# Patient Record
Sex: Male | Born: 1960 | Race: White | Hispanic: No | Marital: Married | Smoking: Former smoker
Health system: Southern US, Community
[De-identification: ages and names within clinical notes are randomized; demographics above are authoritative.]

## PROBLEM LIST (undated history)

## (undated) DIAGNOSIS — I482 Chronic atrial fibrillation, unspecified: Secondary | ICD-10-CM

## (undated) DIAGNOSIS — C801 Malignant (primary) neoplasm, unspecified: Secondary | ICD-10-CM

## (undated) DIAGNOSIS — E119 Type 2 diabetes mellitus without complications: Secondary | ICD-10-CM

## (undated) DIAGNOSIS — E039 Hypothyroidism, unspecified: Secondary | ICD-10-CM

## (undated) DIAGNOSIS — K219 Gastro-esophageal reflux disease without esophagitis: Secondary | ICD-10-CM

## (undated) DIAGNOSIS — Z95810 Presence of automatic (implantable) cardiac defibrillator: Secondary | ICD-10-CM

## (undated) DIAGNOSIS — R0600 Dyspnea, unspecified: Secondary | ICD-10-CM

## (undated) DIAGNOSIS — I509 Heart failure, unspecified: Secondary | ICD-10-CM

## (undated) DIAGNOSIS — Z950 Presence of cardiac pacemaker: Secondary | ICD-10-CM

## (undated) DIAGNOSIS — M1990 Unspecified osteoarthritis, unspecified site: Secondary | ICD-10-CM

## (undated) DIAGNOSIS — G4730 Sleep apnea, unspecified: Secondary | ICD-10-CM

## (undated) DIAGNOSIS — I499 Cardiac arrhythmia, unspecified: Secondary | ICD-10-CM

## (undated) HISTORY — DX: Presence of automatic (implantable) cardiac defibrillator: Z95.810

## (undated) HISTORY — PX: WRIST SURGERY: SHX841

## (undated) HISTORY — PX: APPENDECTOMY: SHX54

## (undated) HISTORY — PX: PACEMAKER PLACEMENT: SHX43

## (undated) HISTORY — PX: ROTATOR CUFF REPAIR: SHX139

## (undated) HISTORY — PX: SHOULDER SURGERY: SHX246

---

## 2002-07-23 ENCOUNTER — Ambulatory Visit (HOSPITAL_COMMUNITY): Admission: RE | Admit: 2002-07-23 | Discharge: 2002-07-23 | Payer: Self-pay | Admitting: Cardiology

## 2007-05-16 ENCOUNTER — Ambulatory Visit (HOSPITAL_COMMUNITY): Payer: Self-pay | Admitting: Psychology

## 2007-06-22 ENCOUNTER — Ambulatory Visit (HOSPITAL_COMMUNITY): Payer: Self-pay | Admitting: Psychology

## 2007-07-05 ENCOUNTER — Ambulatory Visit (HOSPITAL_COMMUNITY): Payer: Self-pay | Admitting: Psychology

## 2007-08-10 ENCOUNTER — Ambulatory Visit (HOSPITAL_COMMUNITY): Payer: Self-pay | Admitting: Psychology

## 2007-09-10 ENCOUNTER — Ambulatory Visit (HOSPITAL_COMMUNITY): Payer: Self-pay | Admitting: Psychology

## 2007-10-08 ENCOUNTER — Ambulatory Visit (HOSPITAL_COMMUNITY): Payer: Self-pay | Admitting: Psychology

## 2007-11-05 ENCOUNTER — Ambulatory Visit (HOSPITAL_COMMUNITY): Payer: Self-pay | Admitting: Psychology

## 2007-12-07 ENCOUNTER — Ambulatory Visit (HOSPITAL_COMMUNITY): Payer: Self-pay | Admitting: Psychology

## 2010-02-24 ENCOUNTER — Ambulatory Visit: Payer: Self-pay | Admitting: Anesthesiology

## 2010-04-28 ENCOUNTER — Ambulatory Visit: Payer: Self-pay | Admitting: Anesthesiology

## 2010-05-27 ENCOUNTER — Ambulatory Visit: Payer: Self-pay | Admitting: Anesthesiology

## 2010-06-29 ENCOUNTER — Ambulatory Visit: Payer: Self-pay | Admitting: Anesthesiology

## 2010-08-24 ENCOUNTER — Ambulatory Visit: Payer: Self-pay | Admitting: Anesthesiology

## 2010-10-22 ENCOUNTER — Ambulatory Visit: Payer: Self-pay | Admitting: Anesthesiology

## 2010-12-15 ENCOUNTER — Ambulatory Visit: Payer: Self-pay | Admitting: Anesthesiology

## 2011-02-09 ENCOUNTER — Ambulatory Visit: Payer: Self-pay | Admitting: Anesthesiology

## 2011-04-11 ENCOUNTER — Ambulatory Visit: Payer: Self-pay | Admitting: Anesthesiology

## 2011-05-13 NOTE — Op Note (Signed)
NAME:  Adam Andrade, Adam Andrade                          ACCOUNT NO.:  [id number]   MEDICAL RECORD NO.:  [id number]                   PATIENT TYPE:  OIB   LOCATION:  NA                                   FACILITY:  [facility]   PHYSICIAN:  Vonna Kotyk R. Jacinto Halim, M.D.                  DATE OF BIRTH:  1961/01/27   DATE OF PROCEDURE:  DATE OF DISCHARGE:                                 OPERATIVE REPORT   j   PROCEDURE PERFORMED:  Transesophageal echocardiogram and direct current  cardioversion.   SURGEON:  Cristy Hilts. Jacinto Halim, M.D.   INDICATIONS FOR PROCEDURE:  The patient is a 50 year old gentleman with a  history of nonischemic cardiomyopathy with ejection fraction of 30%.  Was  found to be in atrial fibrillation.  He was adequately anticoagulated and he  was referred for evaluation of possible electrical cardioversion given his  left ventricular systolic dysfunction.  A transesophageal echocardiogram is  being performed to evaluate for left atrial appendage clot.   DESCRIPTION OF PROCEDURE:  Using mild sedation using Versed and Demerol, and  using local anesthetic spray, a [employer] O[employer]ipl[employer] probe was easily  introduced into the esophagus and transesophageal echocardiogram was  performed.  There was no complication noted.   LEFT ATRIUM:  The left atrium showed moderate left enlargement.  Left  ventricle:  The left ventricle shows left ventricular size is normal.  There  is markedly generalized hypokinesis.  Ejection fraction was estimated at  around 30%.   RIGHT ATRIUM:  The right atrium shows mild right atrial enlargement.  Right  ventricle:  The right ventricle is normal.   PERICARDIUM:  The pericardium is normal.  Mitral valve:  The mitral valve is  normal.  There is mild mitral valve regurgitation.   TRICUSPID VALVE:  The tricuspid valve is normal.  There is minimal tricuspid  valve regurgitation.   PULMONARY VALVE:  The pulmonary valve is grossly normal.   AORTIC VALVE:  The aortic valve  is normal.  Left atrial appendage:  The left  atrial appendage is well-visualized.  There is no evidence of left atrial  appendage clot by TEE.  The left atrial appendage velocities were 35 cm per  second.   IMPRESSION:  1. Markedly reduced left ventricular systolic function, ejection fraction     30%.  Marked generalized hypokinesis.  2. Mild mitral valve regurgitation.  3. No evidence of left atrial appendage clot by transesophageal     echocardiogram.   RECOMMENDATIONS:  We will proceed with direct current cardioversion.   TECHNIQUE OF DIRECT CURRENT CARDIOVERSION:  With the help of Department of  Anesthesia using Pentothal for deep sedation, direct current cardioversion  was attempted.  The direct current cardioversion was unsuccessful.  Initially, a 200 joule, and then a 300 joule, and a third shock at 300  joules delivered, but the patient continued to remain in atrial  fibrillation.  Hence, the procedure was abandoned.   RECOMMENDATIONS:  Antiarrhythmic therapy, probably with amiodarone, and  reattempt in about four to six weeks can be performed as an outpatient.  Anticoagulation is indicated.  The patient will follow up with Dr.  Alanda Amass.                                                 Cristy Hilts. Jacinto Halim, M.D.    Pilar Plate  D:  07/23/2002  T:  07/25/2002  Job:  [id number]   cc:   Gerlene Burdock A. Alanda Amass, M.D.   [facility]

## 2011-06-01 ENCOUNTER — Ambulatory Visit: Payer: Self-pay | Admitting: Anesthesiology

## 2011-08-02 ENCOUNTER — Ambulatory Visit: Payer: Self-pay | Admitting: Anesthesiology

## 2011-10-07 ENCOUNTER — Ambulatory Visit: Payer: Self-pay | Admitting: Anesthesiology

## 2011-12-09 ENCOUNTER — Ambulatory Visit: Payer: Self-pay | Admitting: Anesthesiology

## 2012-03-01 ENCOUNTER — Ambulatory Visit: Payer: Self-pay | Admitting: Anesthesiology

## 2012-04-27 ENCOUNTER — Ambulatory Visit: Payer: Self-pay | Admitting: Anesthesiology

## 2012-07-26 ENCOUNTER — Ambulatory Visit: Payer: Self-pay | Admitting: Anesthesiology

## 2012-09-10 ENCOUNTER — Ambulatory Visit: Payer: Self-pay | Admitting: Anesthesiology

## 2012-11-15 ENCOUNTER — Ambulatory Visit: Payer: Self-pay | Admitting: Anesthesiology

## 2013-01-17 ENCOUNTER — Ambulatory Visit: Payer: Self-pay | Admitting: Anesthesiology

## 2013-03-13 ENCOUNTER — Ambulatory Visit: Payer: Self-pay | Admitting: Anesthesiology

## 2013-05-29 DIAGNOSIS — C099 Malignant neoplasm of tonsil, unspecified: Secondary | ICD-10-CM | POA: Insufficient documentation

## 2013-05-29 DIAGNOSIS — L409 Psoriasis, unspecified: Secondary | ICD-10-CM | POA: Insufficient documentation

## 2013-05-31 DIAGNOSIS — F4320 Adjustment disorder, unspecified: Secondary | ICD-10-CM | POA: Insufficient documentation

## 2013-05-31 DIAGNOSIS — K219 Gastro-esophageal reflux disease without esophagitis: Secondary | ICD-10-CM | POA: Insufficient documentation

## 2013-05-31 DIAGNOSIS — I10 Essential (primary) hypertension: Secondary | ICD-10-CM | POA: Insufficient documentation

## 2013-05-31 DIAGNOSIS — E669 Obesity, unspecified: Secondary | ICD-10-CM | POA: Insufficient documentation

## 2013-05-31 DIAGNOSIS — E785 Hyperlipidemia, unspecified: Secondary | ICD-10-CM | POA: Insufficient documentation

## 2013-06-03 ENCOUNTER — Ambulatory Visit: Payer: Self-pay | Admitting: Anesthesiology

## 2013-08-05 ENCOUNTER — Ambulatory Visit: Payer: Self-pay | Admitting: Anesthesiology

## 2013-08-06 DIAGNOSIS — I951 Orthostatic hypotension: Secondary | ICD-10-CM | POA: Insufficient documentation

## 2013-08-07 DIAGNOSIS — K5900 Constipation, unspecified: Secondary | ICD-10-CM | POA: Insufficient documentation

## 2013-10-04 ENCOUNTER — Ambulatory Visit: Payer: Self-pay | Admitting: Anesthesiology

## 2013-10-05 ENCOUNTER — Emergency Department (HOSPITAL_COMMUNITY)
Admission: EM | Admit: 2013-10-05 | Discharge: 2013-10-06 | Disposition: A | Discharge status: Home / Self Care | Payer: BC Managed Care – PPO | Attending: Emergency Medicine | Admitting: Emergency Medicine

## 2013-10-05 ENCOUNTER — Encounter (HOSPITAL_COMMUNITY): Payer: Self-pay | Admitting: Emergency Medicine

## 2013-10-05 ENCOUNTER — Emergency Department (HOSPITAL_COMMUNITY): Payer: BC Managed Care – PPO

## 2013-10-05 DIAGNOSIS — Z79899 Other long term (current) drug therapy: Secondary | ICD-10-CM | POA: Insufficient documentation

## 2013-10-05 DIAGNOSIS — C140 Malignant neoplasm of pharynx, unspecified: Secondary | ICD-10-CM | POA: Insufficient documentation

## 2013-10-05 DIAGNOSIS — K9429 Other complications of gastrostomy: Secondary | ICD-10-CM | POA: Insufficient documentation

## 2013-10-05 DIAGNOSIS — R109 Unspecified abdominal pain: Secondary | ICD-10-CM

## 2013-10-05 DIAGNOSIS — Z7901 Long term (current) use of anticoagulants: Secondary | ICD-10-CM | POA: Insufficient documentation

## 2013-10-05 DIAGNOSIS — C760 Malignant neoplasm of head, face and neck: Secondary | ICD-10-CM | POA: Insufficient documentation

## 2013-10-05 DIAGNOSIS — R791 Abnormal coagulation profile: Secondary | ICD-10-CM | POA: Insufficient documentation

## 2013-10-05 DIAGNOSIS — I509 Heart failure, unspecified: Secondary | ICD-10-CM | POA: Insufficient documentation

## 2013-10-05 DIAGNOSIS — Z950 Presence of cardiac pacemaker: Secondary | ICD-10-CM | POA: Insufficient documentation

## 2013-10-05 DIAGNOSIS — Y838 Other surgical procedures as the cause of abnormal reaction of the patient, or of later complication, without mention of misadventure at the time of the procedure: Secondary | ICD-10-CM | POA: Insufficient documentation

## 2013-10-05 DIAGNOSIS — Z87891 Personal history of nicotine dependence: Secondary | ICD-10-CM | POA: Insufficient documentation

## 2013-10-05 HISTORY — DX: Malignant (primary) neoplasm, unspecified: C80.1

## 2013-10-05 HISTORY — DX: Presence of cardiac pacemaker: Z95.0

## 2013-10-05 HISTORY — DX: Heart failure, unspecified: I50.9

## 2013-10-05 MED ORDER — FENTANYL CITRATE 0.05 MG/ML IJ SOLN
50.0000 ug | INTRAMUSCULAR | Status: DC | PRN
  Administered 2013-10-06: 50 ug via INTRAVENOUS
  Filled 2013-10-05: qty 2

## 2013-10-05 MED ORDER — ONDANSETRON HCL 4 MG/2ML IJ SOLN
4.0000 mg | Freq: Once | INTRAMUSCULAR | Status: AC
  Administered 2013-10-06: 4 mg via INTRAVENOUS
  Filled 2013-10-05: qty 2

## 2013-10-05 MED ORDER — SODIUM CHLORIDE 0.9 % IV SOLN
INTRAVENOUS | Status: DC
  Administered 2013-10-06: via INTRAVENOUS

## 2013-10-05 NOTE — ED Notes (Signed)
Pt c/o pain and some drainage from feeding tube today.

## 2013-10-05 NOTE — ED Notes (Signed)
Pt c/o pain with movement to site of feeding tube that just started today and c/o of drainage more than usual, denies fever or N/V

## 2013-10-05 NOTE — ED Provider Notes (Signed)
CSN: [id number]     Arrival date & time 10/05/13  2130 History   None   Scribed for Adam Nielsen, MD, the patient was seen in room APA07/APA07. This chart was scribed by Lewanda Rife, ED scribe. Patient's care was started at 11:14 PM  Chief Complaint  Patient presents with  . feeding tube pain    (Consider location/radiation/quality/duration/timing/severity/associated sxs/prior Treatment) The history is provided by the patient. No language interpreter was used.   HPI Comments: Adam Andrade is a 52 y.o. male who presents to the Emergency Department complaining of constant worsening pain around feeding tube onset today. Reports associated discharge around feeding tube, mild dysphagia secondary to radiation therapy, nausea, and abdominal pain. Describes pain focal and moderate in severity. Denies associated emesis, fever, and back pain. 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 Reports he has not used feeding tube for 2 days and can have PO foods and fluids.   Dr. Earna Coder in [place]  Past Medical History  Diagnosis Date  . Cancer     neck and throat HPV+1  . CHF (congestive heart failure)   . Pacemaker    Past Surgical History  Procedure Laterality Date  . Shoulder surgery    . Appendectomy    . Wrist surgery    . Rotator cuff repair     History reviewed. No pertinent family history. History  Substance Use Topics  . Smoking status: Former Games developer  . Smokeless tobacco: Not on file  . Alcohol Use: No    Review of Systems  Constitutional: Negative for fever.  Gastrointestinal: Positive for abdominal pain. Negative for vomiting.       Feeding tube pain    Musculoskeletal: Negative for back pain.  All other systems reviewed and are negative.  A complete 10 system review of systems was obtained and all systems are negative except as noted in the HPI and PMHx.     Allergies  Review  of patient's allergies indicates no known allergies.  Home Medications   Current Outpatient Rx  Name  Route  Sig  Dispense  Refill  . Dapagliflozin Propanediol (FARXIGA) 5 MG TABS   Oral   Take 5 mg by mouth daily.         . digoxin (LANOXIN) 0.25 MG tablet   Oral   Take 0.25 mg by mouth daily.         . fenofibrate (TRICOR) 145 MG tablet   Oral   Take 145 mg by mouth daily.         Marland Kitchen glimepiride (AMARYL) 4 MG tablet   Oral   Take 4 mg by mouth daily before breakfast.         . HYDROcodone-acetaminophen (NORCO) 10-325 MG per tablet   Oral   Take 1 tablet by mouth every 6 (six) hours as needed.         . lansoprazole (PREVACID) 30 MG capsule   Oral   Take 30 mg by mouth daily.         Marland Kitchen linagliptin (TRADJENTA) 5 MG TABS tablet   Oral   Take 5 mg by mouth daily.         . metFORMIN (GLUCOPHAGE) 1000 MG tablet   Oral   Take 1,000 mg by mouth 2 (two) times daily with a meal.         . metoprolol (LOPRESSOR) 50 MG tablet  Oral   Take 75 mg by mouth 2 (two) times daily.         . quinapril (ACCUPRIL) 10 MG tablet   Oral   Take 10 mg by mouth daily.         . simvastatin (ZOCOR) 40 MG tablet   Oral   Take 40 mg by mouth every evening.         Marland Kitchen spironolactone (ALDACTONE) 25 MG tablet   Oral   Take 25 mg by mouth daily.         Marland Kitchen venlafaxine (EFFEXOR) 75 MG tablet   Oral   Take 75 mg by mouth daily.         Marland Kitchen warfarin (COUMADIN) 5 MG tablet   Oral   Take 5-7.5 mg by mouth daily. Patient takes 1&1/2 tablet(7.5mg ) on Mon.,Wed.,Fri. And 1 tablet(5mg ) all other days          BP 118/85  Pulse 92  Temp(Src) 97.8 F (36.6 C) (Oral)  Resp 20  Ht 5\' 10"  (1.778 m)  Wt 232 lb (105.235 kg)  BMI 33.29 kg/m2  SpO2 97% Physical Exam  Nursing note and vitals reviewed. Constitutional: He is oriented to person, place, and time. He appears well-developed and well-nourished. No distress.  HENT:  Head: Normocephalic and atraumatic.   Mouth/Throat: Uvula is midline. Mucous membranes are dry. No oropharyngeal exudate, posterior oropharyngeal edema or posterior oropharyngeal erythema.  Small area of mild white plaque   Eyes: Conjunctivae and EOM are normal.  Neck: Neck supple. No tracheal deviation present.  Cardiovascular: Normal rate and regular rhythm.   No murmur heard. Pulmonary/Chest: Effort normal and breath sounds normal. No respiratory distress. He has no wheezes. He has no rales.  Abdominal: Soft. There is tenderness.  TTP surrounding feeding tube with some purulent drainage, no erythema, no underlying fluctuance. No abdominal tenderness otherwise.  Musculoskeletal: Normal range of motion.  Neurological: He is alert and oriented to person, place, and time.  Skin: Skin is warm and dry.  Psychiatric: He has a normal mood and affect. His behavior is normal.    ED Course  Procedures (including critical care time)  COORDINATION OF CARE:  Nursing notes reviewed. Vital signs reviewed. Initial pt interview and examination performed.   11:16 PM-Discussed work up plan with pt at bedside, which includes rapid strep screen, CT of abdomen, lipase, CBC, digoxin level, protime-INR, and CMP. Pt agrees with plan.   Treatment plan initiated: Medications  0.9 %  sodium chloride infusion (not administered)  fentaNYL (SUBLIMAZE) injection 50 mcg (not administered)  ondansetron (ZOFRAN) injection 4 mg (not administered)     Initial diagnostic testing ordered.    Labs Review Labs Reviewed  CBC - Abnormal; Notable for the following:    RBC 4.02 (*)    Hemoglobin 11.9 (*)    HCT 35.6 (*)    RDW 15.7 (*)    All other components within normal limits  COMPREHENSIVE METABOLIC PANEL - Abnormal; Notable for the following:    Sodium 134 (*)    Glucose, Bld 60 (*)    Albumin 3.3 (*)    All other components within normal limits  LIPASE, BLOOD - Abnormal; Notable for the following:    Lipase 103 (*)    All other  components within normal limits  DIGOXIN LEVEL - Abnormal; Notable for the following:    Digoxin Level 0.4 (*)    All other components within normal limits  PROTIME-INR - Abnormal; Notable for the following:  Prothrombin Time 37.3 (*)    INR 3.98 (*)    All other components within normal limits  RAPID STREP SCREEN  CULTURE, GROUP A STREP   Imaging Review Ct Abdomen Pelvis W Contrast  10/06/2013   CLINICAL DATA:  Increased pain and drainage at the location of a feeding tube placed 4 weeks ago.  EXAM: CT ABDOMEN AND PELVIS WITH CONTRAST  TECHNIQUE: Multidetector CT imaging of the abdomen and pelvis was performed using the standard protocol following bolus administration of intravenous contrast.  CONTRAST:  OMNIPAQUE IOHEXOL 300 MG/ML  SOLN  COMPARISON:  None.  FINDINGS: Intracardiac pacer and AICD leads. Enlarged left atrium. Normal appearing gastrostomy tube with the button normally positioned within the anterior aspect of the gastric antrum. No adjacent fluid or gas collections.  10 mm mid to upper left renal calculus. 11 mm lower pole right renal calculus. Multiple small bilateral renal cysts. No bladder or ureteral calculi and no hydronephrosis.  Unremarkable non contrasted appearance of the liver, spleen, pancreas and adrenal glands. Normal-sized prostate gland containing a small amount of calcification. Poorly distended gallbladder with no visible gallstones. Redundant sigmoid colon. Surgically absent appendix. Multiple tiny calcified granulomata in the left lower lobe. Lumbar and lower thoracic spine degenerative changes.  IMPRESSION: 1. 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 2. Normal appearing gastrostomy tube. 3. Left atrial enlargement.   Electronically Signed   By: Gordan Payment M.D.   On: 10/06/2013 01:08    IV fluids. IV Zofran. IV fentanyl  On recheck, states pain is improved, minimal tenderness on exam. Plan discharge home  with outpatient followup. Prescription for Keflex provided. Short course pain medications provided. Strict return precautions verbalized is understood. Patient will followup with his primary care physician regarding elevated INR - he will hold his next dose of Coumadin to talk with his physician on Monday prior to resuming his medication.     MDM  Diagnosis: Abdominal pain, elevated INR  Evaluated with labs and imaging reviewed as above - no abscess on CT Improved with IV narcotics Vital signs and nursing notes reviewed and considered   I personally performed the services described in this documentation, which was scribed in my presence. The recorded information has been reviewed and is accurate.    Adam Nielsen, MD 10/06/13 [phone]

## 2013-10-06 LAB — COMPREHENSIVE METABOLIC PANEL
AST: 17 U/L (ref 0–37)
Albumin: 3.3 g/dL — ABNORMAL LOW (ref 3.5–5.2)
Alkaline Phosphatase: 62 U/L (ref 39–117)
BUN: 12 mg/dL (ref 6–23)
CO2: 25 mEq/L (ref 19–32)
Calcium: 9.1 mg/dL (ref 8.4–10.5)
Chloride: 98 mEq/L (ref 96–112)
Potassium: 3.6 mEq/L (ref 3.5–5.1)
Sodium: 134 mEq/L — ABNORMAL LOW (ref 135–145)
Total Bilirubin: 0.3 mg/dL (ref 0.3–1.2)

## 2013-10-06 LAB — PROTIME-INR
INR: 3.98 — ABNORMAL HIGH (ref 0.00–1.49)
Prothrombin Time: 37.3 seconds — ABNORMAL HIGH (ref 11.6–15.2)

## 2013-10-06 LAB — CBC
Hemoglobin: 11.9 g/dL — ABNORMAL LOW (ref 13.0–17.0)
MCV: 88.6 fL (ref 78.0–100.0)
Platelets: 242 10*3/uL (ref 150–400)
RDW: 15.7 % — ABNORMAL HIGH (ref 11.5–15.5)
WBC: 9.9 10*3/uL (ref 4.0–10.5)

## 2013-10-06 MED ORDER — CEPHALEXIN 500 MG PO CAPS: 500.0000 mg | ORAL_CAPSULE | Freq: Four times a day (QID) | ORAL | Status: DC

## 2013-10-06 MED ORDER — SODIUM CHLORIDE 0.9 % IJ SOLN
INTRAMUSCULAR | Status: DC
Start: 2013-10-06 — End: 2013-10-06
  Filled 2013-10-06: qty 250

## 2013-10-06 MED ORDER — IOHEXOL 300 MG/ML  SOLN
100.0000 mL | Freq: Once | INTRAMUSCULAR | Status: AC | PRN
  Administered 2013-10-06: 100 mL via INTRAVENOUS

## 2013-10-06 MED ORDER — HYDROCODONE-ACETAMINOPHEN 5-325 MG PO TABS: 1.0000 | ORAL_TABLET | Freq: Four times a day (QID) | ORAL | Status: DC | PRN

## 2013-10-08 LAB — CULTURE, GROUP A STREP

## 2013-12-03 ENCOUNTER — Ambulatory Visit: Payer: Self-pay | Admitting: Anesthesiology

## 2013-12-26 HISTORY — PX: OTHER SURGICAL HISTORY: SHX169

## 2013-12-26 HISTORY — PX: TONSILLECTOMY: SUR1361

## 2014-03-04 ENCOUNTER — Ambulatory Visit: Payer: Self-pay | Admitting: Anesthesiology

## 2014-05-18 ENCOUNTER — Inpatient Hospital Stay (HOSPITAL_COMMUNITY)
Admission: EM | Admit: 2014-05-18 | Discharge: 2014-05-22 | DRG: 563 | Disposition: A | Discharge status: Home / Self Care | Payer: BC Managed Care – PPO | Attending: General Surgery | Admitting: General Surgery

## 2014-05-18 ENCOUNTER — Emergency Department (HOSPITAL_COMMUNITY): Payer: BC Managed Care – PPO

## 2014-05-18 ENCOUNTER — Encounter (HOSPITAL_COMMUNITY): Payer: Self-pay | Admitting: Emergency Medicine

## 2014-05-18 DIAGNOSIS — E785 Hyperlipidemia, unspecified: Secondary | ICD-10-CM | POA: Diagnosis present

## 2014-05-18 DIAGNOSIS — Z87891 Personal history of nicotine dependence: Secondary | ICD-10-CM

## 2014-05-18 DIAGNOSIS — S0081XA Abrasion of other part of head, initial encounter: Secondary | ICD-10-CM

## 2014-05-18 DIAGNOSIS — S42002A Fracture of unspecified part of left clavicle, initial encounter for closed fracture: Secondary | ICD-10-CM | POA: Diagnosis present

## 2014-05-18 DIAGNOSIS — I10 Essential (primary) hypertension: Secondary | ICD-10-CM | POA: Diagnosis present

## 2014-05-18 DIAGNOSIS — S2242XA Multiple fractures of ribs, left side, initial encounter for closed fracture: Secondary | ICD-10-CM

## 2014-05-18 DIAGNOSIS — Z950 Presence of cardiac pacemaker: Secondary | ICD-10-CM

## 2014-05-18 DIAGNOSIS — I509 Heart failure, unspecified: Secondary | ICD-10-CM | POA: Diagnosis present

## 2014-05-18 DIAGNOSIS — E119 Type 2 diabetes mellitus without complications: Secondary | ICD-10-CM | POA: Diagnosis present

## 2014-05-18 DIAGNOSIS — Z7901 Long term (current) use of anticoagulants: Secondary | ICD-10-CM

## 2014-05-18 DIAGNOSIS — S2249XA Multiple fractures of ribs, unspecified side, initial encounter for closed fracture: Secondary | ICD-10-CM | POA: Diagnosis present

## 2014-05-18 DIAGNOSIS — K219 Gastro-esophageal reflux disease without esophagitis: Secondary | ICD-10-CM | POA: Diagnosis present

## 2014-05-18 DIAGNOSIS — Z8589 Personal history of malignant neoplasm of other organs and systems: Secondary | ICD-10-CM

## 2014-05-18 DIAGNOSIS — S42009A Fracture of unspecified part of unspecified clavicle, initial encounter for closed fracture: Principal | ICD-10-CM | POA: Diagnosis present

## 2014-05-18 DIAGNOSIS — I4891 Unspecified atrial fibrillation: Secondary | ICD-10-CM | POA: Diagnosis present

## 2014-05-18 HISTORY — DX: Cardiac arrhythmia, unspecified: I49.9

## 2014-05-18 LAB — CBC WITH DIFFERENTIAL/PLATELET
Basophils Absolute: 0 10*3/uL (ref 0.0–0.1)
Basophils Relative: 1 % (ref 0–1)
Eosinophils Absolute: 0.2 10*3/uL (ref 0.0–0.7)
Eosinophils Relative: 3 % (ref 0–5)
HEMATOCRIT: 38.1 % — AB (ref 39.0–52.0)
Hemoglobin: 12.9 g/dL — ABNORMAL LOW (ref 13.0–17.0)
Lymphocytes Relative: 15 % (ref 12–46)
Lymphs Abs: 1.3 10*3/uL (ref 0.7–4.0)
MCH: 29.7 pg (ref 26.0–34.0)
MCHC: 33.9 g/dL (ref 30.0–36.0)
MCV: 87.8 fL (ref 78.0–100.0)
MONOS PCT: 10 % (ref 3–12)
Monocytes Absolute: 0.8 10*3/uL (ref 0.1–1.0)
NEUTROS ABS: 6.3 10*3/uL (ref 1.7–7.7)
NEUTROS PCT: 73 % (ref 43–77)
Platelets: 215 10*3/uL (ref 150–400)
RBC: 4.34 MIL/uL (ref 4.22–5.81)
RDW: 13.3 % (ref 11.5–15.5)
WBC: 8.7 10*3/uL (ref 4.0–10.5)

## 2014-05-18 LAB — I-STAT CHEM 8, ED
BUN: 18 mg/dL (ref 6–23)
CALCIUM ION: 1.23 mmol/L (ref 1.12–1.23)
CHLORIDE: 99 meq/L (ref 96–112)
CREATININE: 1.2 mg/dL (ref 0.50–1.35)
GLUCOSE: 318 mg/dL — AB (ref 70–99)
HEMATOCRIT: 40 % (ref 39.0–52.0)
Hemoglobin: 13.6 g/dL (ref 13.0–17.0)
Potassium: 5 mEq/L (ref 3.7–5.3)
Sodium: 135 mEq/L — ABNORMAL LOW (ref 137–147)
TCO2: 25 mmol/L (ref 0–100)

## 2014-05-18 LAB — BASIC METABOLIC PANEL
BUN: 17 mg/dL (ref 6–23)
CHLORIDE: 98 meq/L (ref 96–112)
CO2: 24 mEq/L (ref 19–32)
Calcium: 9.7 mg/dL (ref 8.4–10.5)
Creatinine, Ser: 1.16 mg/dL (ref 0.50–1.35)
GFR, EST AFRICAN AMERICAN: 82 mL/min — AB (ref >90)
GFR, EST NON AFRICAN AMERICAN: 71 mL/min — AB (ref >90)
Glucose, Bld: 323 mg/dL — ABNORMAL HIGH (ref 70–99)
Potassium: 5.2 mEq/L (ref 3.7–5.3)
SODIUM: 133 meq/L — AB (ref 137–147)

## 2014-05-18 LAB — PROTIME-INR
INR: 1.02 (ref 0.00–1.49)
PROTHROMBIN TIME: 13.2 s (ref 11.6–15.2)

## 2014-05-18 LAB — I-STAT TROPONIN, ED: Troponin i, poc: 0 ng/mL (ref 0.00–0.08)

## 2014-05-18 LAB — SAMPLE TO BLOOD BANK

## 2014-05-18 MED ORDER — IOHEXOL 300 MG/ML  SOLN
100.0000 mL | Freq: Once | INTRAMUSCULAR | Status: AC | PRN
  Administered 2014-05-18: 100 mL via INTRAVENOUS

## 2014-05-18 MED ORDER — HYDROMORPHONE HCL PF 1 MG/ML IJ SOLN
1.0000 mg | Freq: Once | INTRAMUSCULAR | Status: AC
  Administered 2014-05-19: 1 mg via INTRAMUSCULAR
  Filled 2014-05-18: qty 1

## 2014-05-18 MED ORDER — TETANUS-DIPHTH-ACELL PERTUSSIS 5-2.5-18.5 LF-MCG/0.5 IM SUSP
0.5000 mL | Freq: Once | INTRAMUSCULAR | Status: AC
  Administered 2014-05-18: 0.5 mL via INTRAMUSCULAR
  Filled 2014-05-18: qty 0.5

## 2014-05-18 MED ORDER — MORPHINE SULFATE 4 MG/ML IJ SOLN
4.0000 mg | Freq: Once | INTRAMUSCULAR | Status: AC
  Administered 2014-05-18: 4 mg via INTRAVENOUS
  Filled 2014-05-18: qty 1

## 2014-05-18 MED ORDER — SODIUM CHLORIDE 0.9 % IV BOLUS (SEPSIS)
500.0000 mL | Freq: Once | INTRAVENOUS | Status: AC
  Administered 2014-05-18: 500 mL via INTRAVENOUS
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
## 2014-05-18 NOTE — Progress Notes (Signed)
Orthopedic Tech Progress Note Patient Details:  Adam Andrade [birth year] [id number]  Patient ID: Adam Andrade, male   DOB: [birth year], 53 y.o.   MRN: [id number] Made level 2 trauma visit  Sharman Garrott 05/18/2014, 10:35 PM
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
## 2014-05-18 NOTE — ED Notes (Signed)
Family at bedside. 

## 2014-05-18 NOTE — ED Notes (Signed)
Unknown tetanus status

## 2014-05-19 ENCOUNTER — Encounter (HOSPITAL_COMMUNITY): Payer: Self-pay | Admitting: *Deleted

## 2014-05-19 DIAGNOSIS — S2249XA Multiple fractures of ribs, unspecified side, initial encounter for closed fracture: Secondary | ICD-10-CM | POA: Diagnosis present

## 2014-05-19 DIAGNOSIS — S42009A Fracture of unspecified part of unspecified clavicle, initial encounter for closed fracture: Secondary | ICD-10-CM

## 2014-05-19 LAB — BASIC METABOLIC PANEL
BUN: 15 mg/dL (ref 6–23)
CALCIUM: 8.9 mg/dL (ref 8.4–10.5)
CO2: 23 mEq/L (ref 19–32)
CREATININE: 0.95 mg/dL (ref 0.50–1.35)
Chloride: 95 mEq/L — ABNORMAL LOW (ref 96–112)
GFR calc non Af Amer: >90 mL/min (ref >90)
Glucose, Bld: 279 mg/dL — ABNORMAL HIGH (ref 70–99)
Potassium: 4.4 mEq/L (ref 3.7–5.3)
Sodium: 133 mEq/L — ABNORMAL LOW (ref 137–147)

## 2014-05-19 LAB — CBC
HCT: 35.4 % — ABNORMAL LOW (ref 39.0–52.0)
Hemoglobin: 12.2 g/dL — ABNORMAL LOW (ref 13.0–17.0)
MCH: 30.1 pg (ref 26.0–34.0)
MCHC: 34.5 g/dL (ref 30.0–36.0)
MCV: 87.4 fL (ref 78.0–100.0)
Platelets: 216 K/uL (ref 150–400)
RBC: 4.05 MIL/uL — ABNORMAL LOW (ref 4.22–5.81)
RDW: 13.3 % (ref 11.5–15.5)
WBC: 12.7 K/uL — ABNORMAL HIGH (ref 4.0–10.5)

## 2014-05-19 LAB — GLUCOSE, CAPILLARY
GLUCOSE-CAPILLARY: 232 mg/dL — AB (ref 70–99)
GLUCOSE-CAPILLARY: 239 mg/dL — AB (ref 70–99)
GLUCOSE-CAPILLARY: 191 mg/dL — AB (ref 70–99)
Glucose-Capillary: 230 mg/dL — ABNORMAL HIGH (ref 70–99)

## 2014-05-19 LAB — MRSA PCR SCREENING: MRSA by PCR: NEGATIVE

## 2014-05-19 MED ORDER — OXYCODONE HCL 5 MG PO TABS
10.0000 mg | ORAL_TABLET | ORAL | Status: DC | PRN
  Administered 2014-05-20 – 2014-05-21 (×4): 10 mg via ORAL
  Filled 2014-05-19 (×4): qty 2

## 2014-05-19 MED ORDER — LINAGLIPTIN 5 MG PO TABS
5.0000 mg | ORAL_TABLET | Freq: Every day | ORAL | Status: DC
  Administered 2014-05-19 – 2014-05-22 (×4): 5 mg via ORAL
  Filled 2014-05-19 (×4): qty 1

## 2014-05-19 MED ORDER — SPIRONOLACTONE 25 MG PO TABS
25.0000 mg | ORAL_TABLET | Freq: Every day | ORAL | Status: DC
  Administered 2014-05-19 – 2014-05-22 (×4): 25 mg via ORAL
  Filled 2014-05-19 (×4): qty 1

## 2014-05-19 MED ORDER — INSULIN ASPART 100 UNIT/ML ~~LOC~~ SOLN
0.0000 [IU] | Freq: Three times a day (TID) | SUBCUTANEOUS | Status: DC
  Administered 2014-05-19 (×2): 5 [IU] via SUBCUTANEOUS
  Administered 2014-05-20 (×2): 3 [IU] via SUBCUTANEOUS
  Administered 2014-05-20: 5 [IU] via SUBCUTANEOUS
  Administered 2014-05-21 (×2): 3 [IU] via SUBCUTANEOUS
  Administered 2014-05-21: 8 [IU] via SUBCUTANEOUS
  Administered 2014-05-22: 5 [IU] via SUBCUTANEOUS

## 2014-05-19 MED ORDER — SODIUM CHLORIDE 0.9 % IJ SOLN: 9.0000 mL | INTRAMUSCULAR | Status: DC | PRN

## 2014-05-19 MED ORDER — ONDANSETRON HCL 4 MG PO TABS: 4.0000 mg | ORAL_TABLET | Freq: Four times a day (QID) | ORAL | Status: DC | PRN

## 2014-05-19 MED ORDER — ONDANSETRON HCL 4 MG/2ML IJ SOLN: 4.0000 mg | Freq: Four times a day (QID) | INTRAMUSCULAR | Status: DC | PRN

## 2014-05-19 MED ORDER — BIOTENE DRY MOUTH MT LIQD
15.0000 mL | Freq: Two times a day (BID) | OROMUCOSAL | Status: DC
  Administered 2014-05-19 – 2014-05-22 (×7): 15 mL via OROMUCOSAL

## 2014-05-19 MED ORDER — NALOXONE HCL 0.4 MG/ML IJ SOLN: 0.4000 mg | INTRAMUSCULAR | Status: DC | PRN

## 2014-05-19 MED ORDER — DIGOXIN 250 MCG PO TABS
0.2500 mg | ORAL_TABLET | Freq: Every day | ORAL | Status: DC
  Administered 2014-05-19 – 2014-05-22 (×4): 0.25 mg via ORAL
  Filled 2014-05-19 (×4): qty 1

## 2014-05-19 MED ORDER — PANTOPRAZOLE SODIUM 40 MG PO TBEC
40.0000 mg | DELAYED_RELEASE_TABLET | Freq: Every day | ORAL | Status: DC
  Administered 2014-05-20 – 2014-05-21 (×2): 40 mg via ORAL
  Filled 2014-05-19 (×2): qty 1

## 2014-05-19 MED ORDER — POTASSIUM CHLORIDE IN NACL 20-0.9 MEQ/L-% IV SOLN
INTRAVENOUS | Status: DC
  Administered 2014-05-19 (×2): via INTRAVENOUS
  Filled 2014-05-19 (×3): qty 1000

## 2014-05-19 MED ORDER — MORPHINE SULFATE 4 MG/ML IJ SOLN: 4.0000 mg | INTRAMUSCULAR | Status: DC | PRN

## 2014-05-19 MED ORDER — OXYCODONE HCL 5 MG PO TABS
5.0000 mg | ORAL_TABLET | ORAL | Status: DC | PRN
  Administered 2014-05-20: 5 mg via ORAL
  Filled 2014-05-19: qty 1

## 2014-05-19 MED ORDER — SIMVASTATIN 40 MG PO TABS
40.0000 mg | ORAL_TABLET | Freq: Every day | ORAL | Status: DC
  Administered 2014-05-19 – 2014-05-22 (×4): 40 mg via ORAL
  Filled 2014-05-19 (×4): qty 1

## 2014-05-19 MED ORDER — BACITRACIN 500 UNIT/GM EX OINT
1.0000 "application " | TOPICAL_OINTMENT | Freq: Two times a day (BID) | CUTANEOUS | Status: DC
  Administered 2014-05-19: 15:00:00 via TOPICAL
  Administered 2014-05-19 – 2014-05-21 (×5): 1 via TOPICAL
  Administered 2014-05-22: 10:00:00 via TOPICAL
  Filled 2014-05-19 (×17): qty 0.9

## 2014-05-19 MED ORDER — FENOFIBRATE 160 MG PO TABS
160.0000 mg | ORAL_TABLET | Freq: Every day | ORAL | Status: DC
  Administered 2014-05-19 – 2014-05-22 (×4): 160 mg via ORAL
  Filled 2014-05-19 (×4): qty 1

## 2014-05-19 MED ORDER — METOPROLOL TARTRATE 50 MG PO TABS
75.0000 mg | ORAL_TABLET | Freq: Two times a day (BID) | ORAL | Status: DC
  Administered 2014-05-19 – 2014-05-22 (×6): 75 mg via ORAL
  Filled 2014-05-19 (×11): qty 1

## 2014-05-19 MED ORDER — LISINOPRIL 10 MG PO TABS
10.0000 mg | ORAL_TABLET | Freq: Every day | ORAL | Status: DC
  Administered 2014-05-19 – 2014-05-22 (×4): 10 mg via ORAL
  Filled 2014-05-19 (×4): qty 1

## 2014-05-19 MED ORDER — ENOXAPARIN SODIUM 40 MG/0.4ML ~~LOC~~ SOLN
40.0000 mg | SUBCUTANEOUS | Status: DC
  Administered 2014-05-20: 40 mg via SUBCUTANEOUS
  Filled 2014-05-19 (×2): qty 0.4

## 2014-05-19 MED ORDER — HYDROMORPHONE 0.3 MG/ML IV SOLN
INTRAVENOUS | Status: DC
  Administered 2014-05-19: 0.6 mg via INTRAVENOUS
  Administered 2014-05-19 (×2): 0.9 mg via INTRAVENOUS
  Administered 2014-05-19: 02:00:00 via INTRAVENOUS
  Administered 2014-05-19: 0.8 mg via INTRAVENOUS
  Administered 2014-05-19: 1.5 mg via INTRAVENOUS
  Administered 2014-05-20: 0.6 mg via INTRAVENOUS
  Administered 2014-05-20: 0.3 mg via INTRAVENOUS
  Administered 2014-05-20: 1.5 mg via INTRAVENOUS
  Administered 2014-05-20: 08:00:00 via INTRAVENOUS
  Filled 2014-05-19 (×2): qty 25

## 2014-05-19 MED ORDER — OXYCODONE HCL 5 MG PO TABS: 2.5000 mg | ORAL_TABLET | ORAL | Status: DC | PRN

## 2014-05-19 MED ORDER — VENLAFAXINE HCL 75 MG PO TABS
75.0000 mg | ORAL_TABLET | Freq: Every day | ORAL | Status: DC
  Administered 2014-05-19 – 2014-05-22 (×4): 75 mg via ORAL
  Filled 2014-05-19 (×4): qty 1

## 2014-05-19 MED ORDER — DIPHENHYDRAMINE HCL 50 MG/ML IJ SOLN: 12.5000 mg | Freq: Four times a day (QID) | INTRAMUSCULAR | Status: DC | PRN

## 2014-05-19 MED ORDER — PANTOPRAZOLE SODIUM 40 MG IV SOLR
40.0000 mg | Freq: Every day | INTRAVENOUS | Status: DC
  Administered 2014-05-19: 40 mg via INTRAVENOUS
  Filled 2014-05-19 (×4): qty 40

## 2014-05-19 MED ORDER — DIPHENHYDRAMINE HCL 12.5 MG/5ML PO ELIX
12.5000 mg | ORAL_SOLUTION | Freq: Four times a day (QID) | ORAL | Status: DC | PRN
  Filled 2014-05-19: qty 5
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
## 2014-05-19 NOTE — Progress Notes (Signed)
1500 attempted to move patient to recliner.  Got him to edge of bed and was in severe pain and claimed he could not breathe.  Laid him back in supine position.  Will try again around dinner.

## 2014-05-19 NOTE — Progress Notes (Signed)
Orthopedic Tech Progress Note Patient Details:  Adam Andrade 16-Apr-1961 [id number]  Ortho Devices Type of Ortho Device: Arm sling   Katheren Shams 05/19/2014, 12:51 AM

## 2014-05-20 ENCOUNTER — Inpatient Hospital Stay (HOSPITAL_COMMUNITY): Payer: BC Managed Care – PPO

## 2014-05-20 LAB — GLUCOSE, CAPILLARY
GLUCOSE-CAPILLARY: 192 mg/dL — AB (ref 70–99)
GLUCOSE-CAPILLARY: 208 mg/dL — AB (ref 70–99)
GLUCOSE-CAPILLARY: 180 mg/dL — AB (ref 70–99)
Glucose-Capillary: 162 mg/dL — ABNORMAL HIGH (ref 70–99)

## 2014-05-20 MED ORDER — WARFARIN - PHYSICIAN DOSING INPATIENT
Freq: Every day | Status: DC
  Administered 2014-05-20: 18:00:00

## 2014-05-20 MED ORDER — TRAMADOL HCL 50 MG PO TABS
50.0000 mg | ORAL_TABLET | Freq: Four times a day (QID) | ORAL | Status: DC | PRN
  Administered 2014-05-20: 50 mg via ORAL
  Filled 2014-05-20: qty 1

## 2014-05-20 MED ORDER — HYDROMORPHONE HCL PF 1 MG/ML IJ SOLN
1.0000 mg | INTRAMUSCULAR | Status: DC | PRN
  Administered 2014-05-20: 1 mg via INTRAVENOUS
  Filled 2014-05-20: qty 1

## 2014-05-20 MED ORDER — WARFARIN SODIUM 5 MG PO TABS
5.0000 mg | ORAL_TABLET | Freq: Every day | ORAL | Status: DC
  Administered 2014-05-20 – 2014-05-21 (×2): 5 mg via ORAL
  Filled 2014-05-20 (×4): qty 1
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
## 2014-05-20 NOTE — Progress Notes (Signed)
Pt's cardiologist, Dr. Alroy Dust in [place], [place] [phone]), said it was OK to resume coumadin 5mg  daily since the pt will not have lithotripsy tomorrow as planned.  Dr. Grandville Silos said it OK to order coumadin 5mg  daily.

## 2014-05-20 NOTE — Progress Notes (Signed)
Inpatient Diabetes Program Recommendations  AACE/ADA: New Consensus Statement on Inpatient Glycemic Control (2013)  Target Ranges:  Prepandial:   less than 140 mg/dL      Peak postprandial:   less than 180 mg/dL (1-2 hours)      Critically ill patients:  140 - 180 mg/dL   Reason for Visit: Hyperglycemia Diabetes history: DM2 Outpatient Diabetes medications: tradjenta 5 mg QD Current orders for Inpatient glycemic control: Novolog moderate tidwc  Results for ELAZAR, ARGABRIGHT (MRN [id number]) as of 05/20/2014 15:14  Ref. Range 05/19/2014 11:41 05/19/2014 16:47 05/19/2014 21:58 05/20/2014 08:17 05/20/2014 12:14  Glucose-Capillary Latest Range: 70-99 mg/dL 230 (H) 239 (H) 191 (H) 192 (H) 208 (H)   Results for ELOY, FEHL (MRN [id number]) as of 05/20/2014 15:14  Ref. Range 05/18/2014 22:20 05/18/2014 22:34 05/19/2014 02:15  Glucose Latest Range: 70-99 mg/dL 323 (H) 318 (H) 279 (H)   Inpatient Diabetes Program Recommendations Correction (SSI): Increase Novolog to resistant tidwc and hs HgbA1C: Check HgbA1C to assess glycemic control prior to hospitalization Diet: CHO mod med  Note: Will follow. Thank you. Lorenda Peck, RD, LDN, CDE Inpatient Diabetes Coordinator [phone]
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
## 2014-05-21 DIAGNOSIS — I499 Cardiac arrhythmia, unspecified: Secondary | ICD-10-CM | POA: Insufficient documentation

## 2014-05-21 DIAGNOSIS — S42002A Fracture of unspecified part of left clavicle, initial encounter for closed fracture: Secondary | ICD-10-CM | POA: Diagnosis present

## 2014-05-21 DIAGNOSIS — I10 Essential (primary) hypertension: Secondary | ICD-10-CM | POA: Insufficient documentation

## 2014-05-21 DIAGNOSIS — E119 Type 2 diabetes mellitus without complications: Secondary | ICD-10-CM | POA: Insufficient documentation

## 2014-05-21 LAB — PROTIME-INR
INR: 1.05 (ref 0.00–1.49)
Prothrombin Time: 13.5 s (ref 11.6–15.2)

## 2014-05-21 LAB — GLUCOSE, CAPILLARY
GLUCOSE-CAPILLARY: 220 mg/dL — AB (ref 70–99)
Glucose-Capillary: 166 mg/dL — ABNORMAL HIGH (ref 70–99)
Glucose-Capillary: 255 mg/dL — ABNORMAL HIGH (ref 70–99)
Glucose-Capillary: 176 mg/dL — ABNORMAL HIGH (ref 70–99)

## 2014-05-21 MED ORDER — NAPROXEN 500 MG PO TABS
500.0000 mg | ORAL_TABLET | Freq: Two times a day (BID) | ORAL | Status: DC
  Administered 2014-05-21 – 2014-05-22 (×3): 500 mg via ORAL
  Filled 2014-05-21 (×5): qty 1

## 2014-05-21 MED ORDER — ENOXAPARIN SODIUM 30 MG/0.3ML ~~LOC~~ SOLN
30.0000 mg | Freq: Two times a day (BID) | SUBCUTANEOUS | Status: DC
  Administered 2014-05-21 – 2014-05-22 (×3): 30 mg via SUBCUTANEOUS
  Filled 2014-05-21 (×4): qty 0.3

## 2014-05-21 MED ORDER — MORPHINE SULFATE 2 MG/ML IJ SOLN: 2.0000 mg | INTRAMUSCULAR | Status: DC | PRN

## 2014-05-21 MED ORDER — OXYCODONE HCL 5 MG PO TABS
10.0000 mg | ORAL_TABLET | ORAL | Status: DC | PRN
  Administered 2014-05-21 (×3): 15 mg via ORAL
  Administered 2014-05-22 (×2): 20 mg via ORAL
  Filled 2014-05-21: qty 3
  Filled 2014-05-21 (×2): qty 4
  Filled 2014-05-21: qty 3
  Filled 2014-05-21: qty 4

## 2014-05-21 NOTE — Progress Notes (Signed)
UR completed.  Nevia Henkin, RN BSN MHA CCM Trauma/Neuro ICU Case Manager [phone]  

## 2014-05-21 NOTE — Progress Notes (Signed)
Sore after getting up this AM. Pain meds adjusted. Therapy evals P. Patient examined and I agree with the assessment and plan  Georganna Skeans, MD, MPH, FACS Trauma: [phone] General Surgery: [phone]  05/21/2014 10:41 AM
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
## 2014-05-21 NOTE — Progress Notes (Signed)
PT Cancellation Note  Patient Details Name: AMRON GUERRETTE MRN: [id number] DOB: 19-Nov-1961   Cancelled Treatment:    Reason Eval/Treat Not Completed: OT screened, no needs identified, will sign off.  OT screened for PT needs, none needed at this time.  See OT evaluation note for details. PT to sign off.   Thanks,    Barbarann Ehlers. [place], [place], DPT [phone]   05/21/2014, 5:02 PM
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
## 2014-05-22 ENCOUNTER — Encounter (HOSPITAL_COMMUNITY): Payer: Self-pay

## 2014-05-22 LAB — PROTIME-INR
INR: 1.04 (ref 0.00–1.49)
PROTHROMBIN TIME: 13.4 s (ref 11.6–15.2)

## 2014-05-22 LAB — GLUCOSE, CAPILLARY: Glucose-Capillary: 235 mg/dL — ABNORMAL HIGH (ref 70–99)

## 2014-05-22 MED ORDER — OXYCODONE-ACETAMINOPHEN 10-325 MG PO TABS: 1.0000 | ORAL_TABLET | ORAL | Status: DC | PRN

## 2014-05-22 NOTE — Discharge Instructions (Addendum)
For Clavicle:  Keep the sling in place until follow up with Dr Veverly Fells No use of the left arm. Call Dr Veverly Fells for appointment in two weeks. [phone]  Information on my medicine - Coumadin   (Warfarin)  This medication education was reviewed with me or my healthcare representative as part of my discharge preparation.  The pharmacist that spoke with me during my hospital stay was:  Jaquita Folds, [facility]  Why was Coumadin prescribed for you? Coumadin was prescribed for you because you have a blood clot or a medical condition that can cause an increased risk of forming blood clots. Blood clots can cause serious health problems by blocking the flow of blood to the heart, lung, or brain. Coumadin can prevent harmful blood clots from forming. As a reminder your indication for Coumadin is:   Stroke Prevention Because Of Atrial Fibrillation  What test will check on my response to Coumadin? While on Coumadin (warfarin) you will need to have an INR test regularly to ensure that your dose is keeping you in the desired range. The INR (international normalized ratio) number is calculated from the result of the laboratory test called prothrombin time (PT).  If an INR APPOINTMENT HAS NOT ALREADY BEEN MADE FOR YOU please schedule an appointment to have this lab work done by your health care provider within 7 days. Your INR goal is usually a number between:  2 to 3 or your provider may give you a more narrow range like 2-2.5.  Ask your health care provider during an office visit what your goal INR is.  What  do you need to  know  About  COUMADIN? Take Coumadin (warfarin) exactly as prescribed by your healthcare provider about the same time each day.  DO NOT stop taking without talking to the doctor who prescribed the medication.  Stopping without other blood clot prevention medication to take the place of Coumadin may increase your risk of developing a new clot or stroke.  Get refills before you run out.  What do  you do if you miss a dose? If you miss a dose, take it as soon as you remember on the same day then continue your regularly scheduled regimen the next day.  Do not take two doses of Coumadin at the same time.  Important Safety Information A possible side effect of Coumadin (Warfarin) is an increased risk of bleeding. You should call your healthcare provider right away if you experience any of the following:   Bleeding from an injury or your nose that does not stop.   Unusual colored urine (red or dark brown) or unusual colored stools (red or black).   Unusual bruising for unknown reasons.   A serious fall or if you hit your head (even if there is no bleeding).  Some foods or medicines interact with Coumadin (warfarin) and might alter your response to warfarin. To help avoid this:   Eat a balanced diet, maintaining a consistent amount of Vitamin K.   Notify your provider about major diet changes you plan to make.   Avoid alcohol or limit your intake to 1 drink for women and 2 drinks for men per day. (1 drink is 5 oz. wine, 12 oz. beer, or 1.5 oz. liquor.)  Make sure that ANY health care provider who prescribes medication for you knows that you are taking Coumadin (warfarin).  Also make sure the healthcare provider who is monitoring your Coumadin knows when you have started a new medication including  herbals and non-prescription products.  Coumadin (Warfarin)  Major Drug Interactions  Increased Warfarin Effect Decreased Warfarin Effect  Alcohol (large quantities) Antibiotics (esp. Septra/Bactrim, Flagyl, Cipro) Amiodarone (Cordarone) Aspirin (ASA) Cimetidine (Tagamet) Megestrol (Megace) NSAIDs (ibuprofen, naproxen, etc.) Piroxicam (Feldene) Propafenone (Rythmol SR) Propranolol (Inderal) Isoniazid (INH) Posaconazole (Noxafil) Barbiturates (Phenobarbital) Carbamazepine (Tegretol) Chlordiazepoxide (Librium) Cholestyramine (Questran) Griseofulvin Oral  Contraceptives Rifampin Sucralfate (Carafate) Vitamin K   Coumadin (Warfarin) Major Herbal Interactions  Increased Warfarin Effect Decreased Warfarin Effect  Garlic Ginseng Ginkgo biloba Coenzyme Q10 Green tea St. Johns wort    Coumadin (Warfarin) FOOD Interactions  Eat a consistent number of servings per week of foods HIGH in Vitamin K (1 serving =  cup)  Collards (cooked, or boiled & drained) Kale (cooked, or boiled & drained) Mustard greens (cooked, or boiled & drained) Parsley *serving size only =  cup Spinach (cooked, or boiled & drained) Swiss chard (cooked, or boiled & drained) Turnip greens (cooked, or boiled & drained)  Eat a consistent number of servings per week of foods MEDIUM-HIGH in Vitamin K (1 serving = 1 cup)  Asparagus (cooked, or boiled & drained) Broccoli (cooked, boiled & drained, or raw & chopped) Brussel sprouts (cooked, or boiled & drained) *serving size only =  cup Lettuce, raw (green leaf, endive, romaine) Spinach, raw Turnip greens, raw & chopped   These websites have more information on Coumadin (warfarin):  FailFactory.se; VeganReport.com.au;  No driving while taking oxycodone.  Wash wounds daily with soap and water. Apply antibiotic ointment (e.g. Neosporin) twice daily and as needed to keep moist.

## 2014-05-22 NOTE — Discharge Summary (Signed)
[name], MD, MPH, FACS Trauma: [phone] General Surgery: [phone]  

## 2014-05-22 NOTE — Progress Notes (Signed)
D/C Georganna Skeans, MD, MPH, FACS Trauma: [phone] General Surgery: [phone]
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
## 2014-06-05 ENCOUNTER — Ambulatory Visit: Payer: Self-pay | Admitting: Anesthesiology

## 2014-09-02 ENCOUNTER — Ambulatory Visit: Payer: Self-pay | Admitting: Anesthesiology

## 2014-09-30 ENCOUNTER — Ambulatory Visit: Payer: Self-pay | Admitting: Anesthesiology

## 2014-12-30 ENCOUNTER — Ambulatory Visit: Payer: Self-pay | Admitting: Anesthesiology

## 2015-04-22 ENCOUNTER — Ambulatory Visit
Admit: 2015-04-22 | Disposition: A | Discharge status: Home / Self Care | Payer: Self-pay | Attending: Anesthesiology | Admitting: Anesthesiology

## 2015-07-20 ENCOUNTER — Encounter: Payer: Self-pay | Admitting: Anesthesiology

## 2015-07-20 ENCOUNTER — Ambulatory Visit: Payer: BLUE CROSS/BLUE SHIELD | Attending: Anesthesiology | Admitting: Anesthesiology

## 2015-07-20 VITALS — BP 112/70 | HR 81 | Temp 97.9°F | Resp 18 | Ht 70.0 in | Wt 260.0 lb

## 2015-07-20 DIAGNOSIS — E119 Type 2 diabetes mellitus without complications: Secondary | ICD-10-CM | POA: Diagnosis not present

## 2015-07-20 DIAGNOSIS — I10 Essential (primary) hypertension: Secondary | ICD-10-CM | POA: Insufficient documentation

## 2015-07-20 DIAGNOSIS — E785 Hyperlipidemia, unspecified: Secondary | ICD-10-CM | POA: Diagnosis not present

## 2015-07-20 DIAGNOSIS — I509 Heart failure, unspecified: Secondary | ICD-10-CM | POA: Diagnosis not present

## 2015-07-20 DIAGNOSIS — M79642 Pain in left hand: Secondary | ICD-10-CM | POA: Diagnosis not present

## 2015-07-20 DIAGNOSIS — F419 Anxiety disorder, unspecified: Secondary | ICD-10-CM | POA: Insufficient documentation

## 2015-07-20 DIAGNOSIS — M79641 Pain in right hand: Secondary | ICD-10-CM | POA: Diagnosis not present

## 2015-07-20 DIAGNOSIS — F1190 Opioid use, unspecified, uncomplicated: Secondary | ICD-10-CM | POA: Diagnosis not present

## 2015-07-20 DIAGNOSIS — Z950 Presence of cardiac pacemaker: Secondary | ICD-10-CM | POA: Insufficient documentation

## 2015-07-20 DIAGNOSIS — G8929 Other chronic pain: Secondary | ICD-10-CM | POA: Diagnosis not present

## 2015-07-20 DIAGNOSIS — M545 Low back pain: Secondary | ICD-10-CM | POA: Diagnosis present

## 2015-07-20 DIAGNOSIS — M25512 Pain in left shoulder: Secondary | ICD-10-CM

## 2015-07-20 DIAGNOSIS — M19019 Primary osteoarthritis, unspecified shoulder: Secondary | ICD-10-CM | POA: Diagnosis not present

## 2015-07-20 DIAGNOSIS — K219 Gastro-esophageal reflux disease without esophagitis: Secondary | ICD-10-CM | POA: Diagnosis not present

## 2015-07-20 DIAGNOSIS — G518 Other disorders of facial nerve: Secondary | ICD-10-CM

## 2015-07-20 MED ORDER — OXYCODONE HCL 10 MG PO TABS: 10.0000 mg | ORAL_TABLET | Freq: Every day | ORAL | Status: DC

## 2015-07-20 NOTE — Progress Notes (Signed)
Safety precautions to be maintained throughout the outpatient stay will include: orient to surroundings, keep bed in low position, maintain call bell within reach at all times, provide assistance with transfer out of bed and ambulation.  
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
## 2015-08-06 ENCOUNTER — Other Ambulatory Visit: Payer: Self-pay | Admitting: Anesthesiology

## 2015-10-14 ENCOUNTER — Telehealth: Payer: Self-pay

## 2015-10-14 NOTE — Telephone Encounter (Signed)
Adam Andrade pt wants to know can he get a prescription before November 1 pt will be out of meds Nov 1

## 2015-10-19 NOTE — Telephone Encounter (Signed)
Left voicemail telling patient that dr Andree Elk would not be back in the office until 10-26-15. Instructed to call back on 10-26-15 and remind me to ask dr Andree Elk if he will refill meds.

## 2015-10-27 ENCOUNTER — Telehealth: Payer: Self-pay | Admitting: Anesthesiology

## 2015-10-27 NOTE — Telephone Encounter (Signed)
Out of meds today , wants to know if dr Andree Elk will write one month script to do patient until he can get in for appt?  [phone]

## 2015-10-27 NOTE — Telephone Encounter (Signed)
Cannot refill medications with out appointment. Must make appointment. Left voicemail.

## 2015-11-02 ENCOUNTER — Ambulatory Visit: Payer: BLUE CROSS/BLUE SHIELD | Attending: Anesthesiology | Admitting: Anesthesiology

## 2015-11-02 ENCOUNTER — Encounter: Payer: Self-pay | Admitting: Anesthesiology

## 2015-11-02 ENCOUNTER — Other Ambulatory Visit: Payer: Self-pay | Admitting: Anesthesiology

## 2015-11-02 VITALS — BP 109/72 | HR 61 | Temp 98.1°F | Resp 15 | Ht 70.0 in | Wt 275.0 lb

## 2015-11-02 DIAGNOSIS — G8929 Other chronic pain: Secondary | ICD-10-CM | POA: Diagnosis not present

## 2015-11-02 DIAGNOSIS — M545 Low back pain: Secondary | ICD-10-CM | POA: Diagnosis present

## 2015-11-02 DIAGNOSIS — M25519 Pain in unspecified shoulder: Secondary | ICD-10-CM | POA: Diagnosis not present

## 2015-11-02 DIAGNOSIS — E119 Type 2 diabetes mellitus without complications: Secondary | ICD-10-CM | POA: Insufficient documentation

## 2015-11-02 DIAGNOSIS — E785 Hyperlipidemia, unspecified: Secondary | ICD-10-CM | POA: Diagnosis not present

## 2015-11-02 DIAGNOSIS — M19019 Primary osteoarthritis, unspecified shoulder: Secondary | ICD-10-CM | POA: Insufficient documentation

## 2015-11-02 DIAGNOSIS — M25542 Pain in joints of left hand: Secondary | ICD-10-CM | POA: Diagnosis not present

## 2015-11-02 DIAGNOSIS — Z950 Presence of cardiac pacemaker: Secondary | ICD-10-CM | POA: Diagnosis not present

## 2015-11-02 DIAGNOSIS — K219 Gastro-esophageal reflux disease without esophagitis: Secondary | ICD-10-CM | POA: Diagnosis not present

## 2015-11-02 DIAGNOSIS — M25512 Pain in left shoulder: Secondary | ICD-10-CM

## 2015-11-02 DIAGNOSIS — I10 Essential (primary) hypertension: Secondary | ICD-10-CM | POA: Diagnosis not present

## 2015-11-02 DIAGNOSIS — G518 Other disorders of facial nerve: Secondary | ICD-10-CM | POA: Diagnosis not present

## 2015-11-02 DIAGNOSIS — M25541 Pain in joints of right hand: Secondary | ICD-10-CM | POA: Insufficient documentation

## 2015-11-02 DIAGNOSIS — F1190 Opioid use, unspecified, uncomplicated: Secondary | ICD-10-CM | POA: Diagnosis not present

## 2015-11-02 DIAGNOSIS — I509 Heart failure, unspecified: Secondary | ICD-10-CM | POA: Insufficient documentation

## 2015-11-02 DIAGNOSIS — F419 Anxiety disorder, unspecified: Secondary | ICD-10-CM | POA: Insufficient documentation

## 2015-11-02 MED ORDER — OXYCODONE HCL 10 MG PO TABS: 10.0000 mg | ORAL_TABLET | Freq: Every day | ORAL | Status: DC

## 2015-11-02 NOTE — Progress Notes (Signed)
Safety precautions to be maintained throughout the outpatient stay will include: orient to surroundings, keep bed in low position, maintain call bell within reach at all times, provide assistance with transfer out of bed and ambulation.  
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
## 2015-11-10 LAB — TOXASSURE SELECT 13 (MW), URINE: PDF: 0

## 2015-12-01 ENCOUNTER — Other Ambulatory Visit: Payer: Self-pay | Admitting: Anesthesiology

## 2015-12-14 ENCOUNTER — Encounter: Payer: Self-pay | Admitting: Anesthesiology

## 2015-12-14 ENCOUNTER — Ambulatory Visit: Payer: BLUE CROSS/BLUE SHIELD | Attending: Anesthesiology | Admitting: Anesthesiology

## 2015-12-14 VITALS — BP 94/73 | HR 81 | Temp 97.5°F | Resp 16 | Ht 70.0 in | Wt 257.0 lb

## 2015-12-14 DIAGNOSIS — M25512 Pain in left shoulder: Secondary | ICD-10-CM | POA: Diagnosis present

## 2015-12-14 DIAGNOSIS — G518 Other disorders of facial nerve: Secondary | ICD-10-CM | POA: Insufficient documentation

## 2015-12-14 MED ORDER — OXYCODONE HCL 10 MG PO TABS: 10.0000 mg | ORAL_TABLET | Freq: Every day | ORAL | Status: DC

## 2015-12-14 NOTE — Progress Notes (Signed)
Safety precautions to be maintained throughout the outpatient stay will include: orient to surroundings, keep bed in low position, maintain call bell within reach at all times, provide assistance with transfer out of bed and ambulation.  
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
## 2015-12-17 ENCOUNTER — Emergency Department (HOSPITAL_COMMUNITY): Payer: BLUE CROSS/BLUE SHIELD

## 2015-12-17 ENCOUNTER — Emergency Department (HOSPITAL_COMMUNITY)
Admission: EM | Admit: 2015-12-17 | Discharge: 2015-12-17 | Disposition: A | Discharge status: Home / Self Care | Payer: BLUE CROSS/BLUE SHIELD | Attending: Emergency Medicine | Admitting: Emergency Medicine

## 2015-12-17 ENCOUNTER — Encounter (HOSPITAL_COMMUNITY): Payer: Self-pay | Admitting: *Deleted

## 2015-12-17 DIAGNOSIS — Z95810 Presence of automatic (implantable) cardiac defibrillator: Secondary | ICD-10-CM | POA: Insufficient documentation

## 2015-12-17 DIAGNOSIS — Z7984 Long term (current) use of oral hypoglycemic drugs: Secondary | ICD-10-CM | POA: Insufficient documentation

## 2015-12-17 DIAGNOSIS — S61239A Puncture wound without foreign body of unspecified finger without damage to nail, initial encounter: Secondary | ICD-10-CM

## 2015-12-17 DIAGNOSIS — E119 Type 2 diabetes mellitus without complications: Secondary | ICD-10-CM | POA: Insufficient documentation

## 2015-12-17 DIAGNOSIS — S61205A Unspecified open wound of left ring finger without damage to nail, initial encounter: Secondary | ICD-10-CM | POA: Diagnosis present

## 2015-12-17 DIAGNOSIS — Z7901 Long term (current) use of anticoagulants: Secondary | ICD-10-CM | POA: Diagnosis not present

## 2015-12-17 DIAGNOSIS — I499 Cardiac arrhythmia, unspecified: Secondary | ICD-10-CM | POA: Diagnosis not present

## 2015-12-17 DIAGNOSIS — Z23 Encounter for immunization: Secondary | ICD-10-CM | POA: Insufficient documentation

## 2015-12-17 DIAGNOSIS — Z79891 Long term (current) use of opiate analgesic: Secondary | ICD-10-CM | POA: Insufficient documentation

## 2015-12-17 DIAGNOSIS — Y998 Other external cause status: Secondary | ICD-10-CM | POA: Diagnosis not present

## 2015-12-17 DIAGNOSIS — I482 Chronic atrial fibrillation: Secondary | ICD-10-CM | POA: Insufficient documentation

## 2015-12-17 DIAGNOSIS — I509 Heart failure, unspecified: Secondary | ICD-10-CM | POA: Insufficient documentation

## 2015-12-17 DIAGNOSIS — W320XXA Accidental handgun discharge, initial encounter: Secondary | ICD-10-CM | POA: Insufficient documentation

## 2015-12-17 DIAGNOSIS — Y9389 Activity, other specified: Secondary | ICD-10-CM | POA: Diagnosis not present

## 2015-12-17 DIAGNOSIS — Y9289 Other specified places as the place of occurrence of the external cause: Secondary | ICD-10-CM | POA: Insufficient documentation

## 2015-12-17 DIAGNOSIS — Z85819 Personal history of malignant neoplasm of unspecified site of lip, oral cavity, and pharynx: Secondary | ICD-10-CM | POA: Diagnosis not present

## 2015-12-17 DIAGNOSIS — Z79899 Other long term (current) drug therapy: Secondary | ICD-10-CM | POA: Diagnosis not present

## 2015-12-17 DIAGNOSIS — Z87891 Personal history of nicotine dependence: Secondary | ICD-10-CM | POA: Diagnosis not present

## 2015-12-17 DIAGNOSIS — W3400XA Accidental discharge from unspecified firearms or gun, initial encounter: Secondary | ICD-10-CM

## 2015-12-17 HISTORY — DX: Type 2 diabetes mellitus without complications: E11.9

## 2015-12-17 HISTORY — DX: Chronic atrial fibrillation, unspecified: I48.20

## 2015-12-17 MED ORDER — CEPHALEXIN 500 MG PO CAPS: 500.0000 mg | ORAL_CAPSULE | Freq: Four times a day (QID) | ORAL | Status: DC

## 2015-12-17 MED ORDER — TETANUS-DIPHTH-ACELL PERTUSSIS 5-2.5-18.5 LF-MCG/0.5 IM SUSP
0.5000 mL | Freq: Once | INTRAMUSCULAR | Status: AC
  Administered 2015-12-17: 0.5 mL via INTRAMUSCULAR
  Filled 2015-12-17: qty 0.5
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
## 2016-01-21 IMAGING — CR DG CHEST 1V PORT
1 series · 1 of 1 positions shown · non-contrast
Comparison: 05/18/2014

CLINICAL DATA: Rib fractures

EXAM:
PORTABLE CHEST - 1 VIEW

[AP]
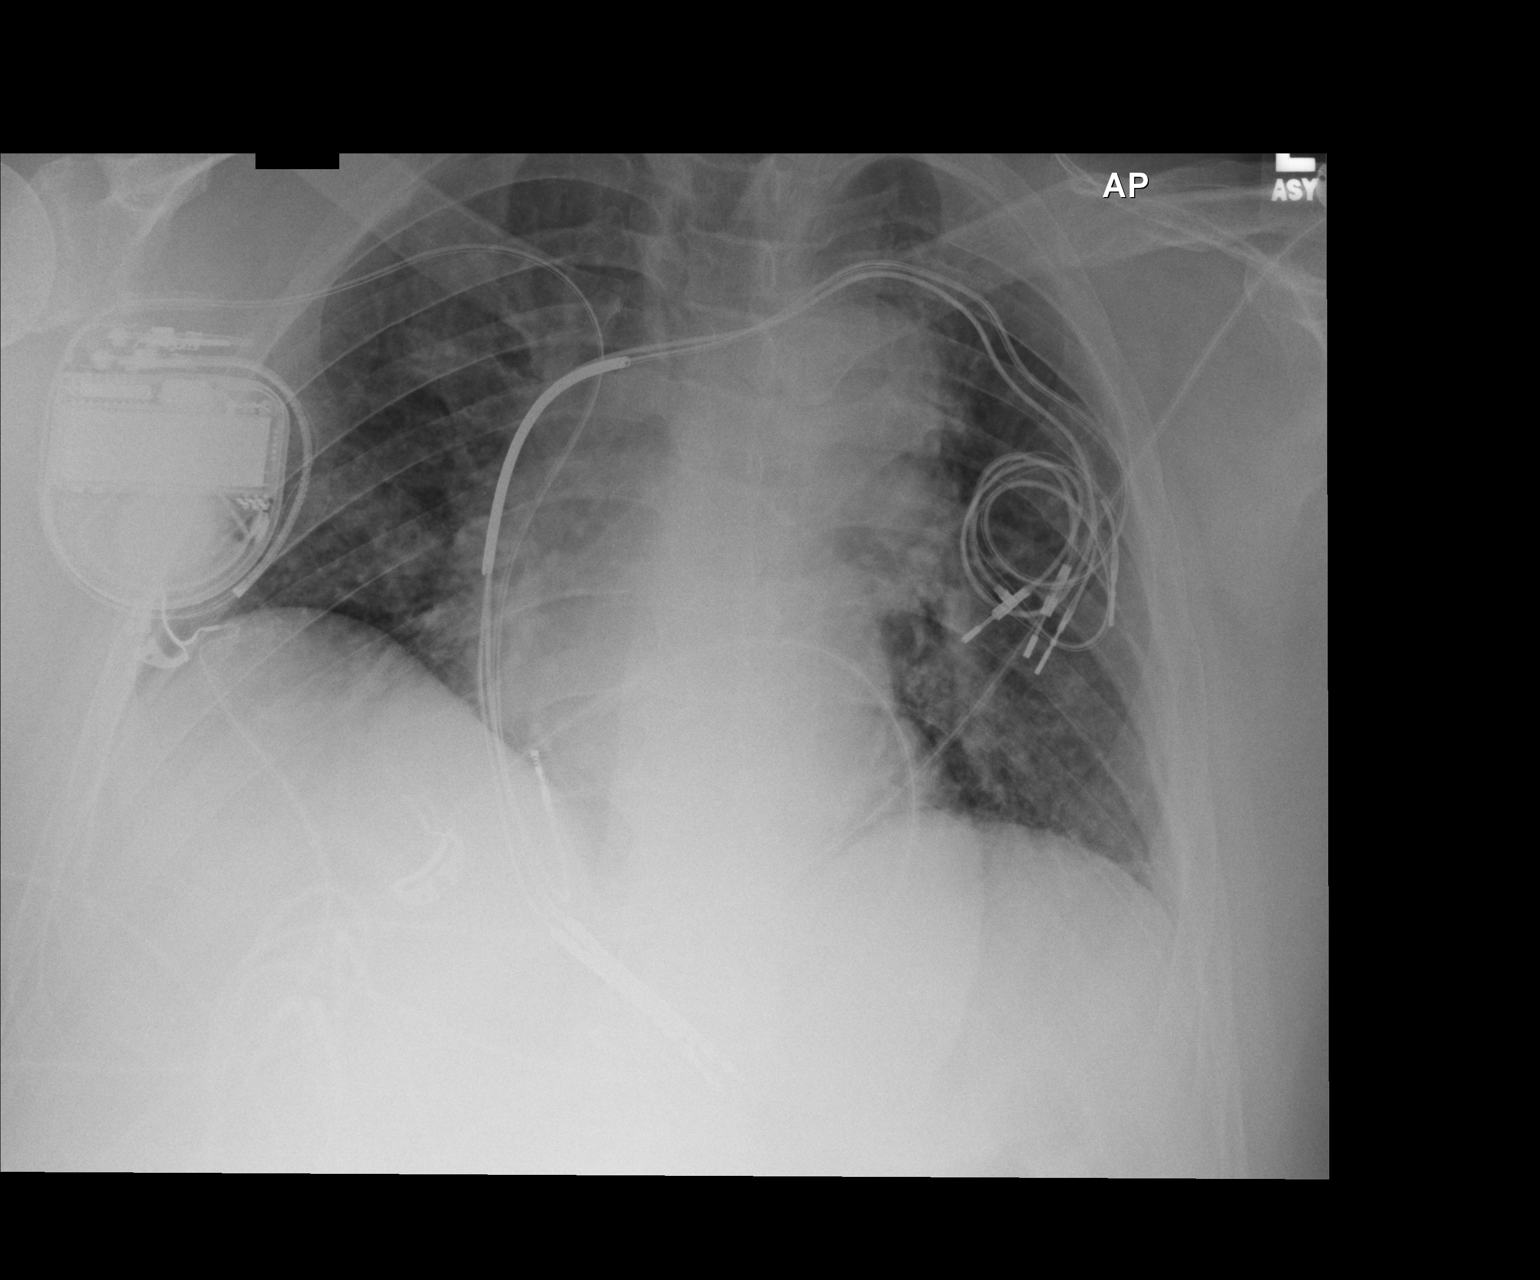

[1 of 1 positions shown; findings below may reference images not displayed]

FINDINGS: Cardiac shadow is stable. A defibrillator is again seen and stable.
Multiple residual defibrillator leads are seen as well. Mediastinum
is again prominent but in part related to patient rotation.
Deformity of the left clavicle is again seen. The lungs are
well-aerated without focal infiltrate or sizable effusion. The
previously noted rib fractures are not well appreciated this exam.
IMPRESSION: No acute abnormality noted.

## 2016-02-24 ENCOUNTER — Telehealth: Payer: Self-pay | Admitting: *Deleted

## 2016-02-24 ENCOUNTER — Ambulatory Visit: Payer: BLUE CROSS/BLUE SHIELD | Attending: Anesthesiology | Admitting: Anesthesiology

## 2016-02-24 ENCOUNTER — Encounter: Payer: Self-pay | Admitting: Anesthesiology

## 2016-02-24 VITALS — BP 102/56 | HR 69 | Temp 97.1°F | Resp 16 | Ht 70.0 in | Wt 265.0 lb

## 2016-02-24 DIAGNOSIS — E119 Type 2 diabetes mellitus without complications: Secondary | ICD-10-CM | POA: Diagnosis not present

## 2016-02-24 DIAGNOSIS — I509 Heart failure, unspecified: Secondary | ICD-10-CM | POA: Insufficient documentation

## 2016-02-24 DIAGNOSIS — E785 Hyperlipidemia, unspecified: Secondary | ICD-10-CM | POA: Diagnosis not present

## 2016-02-24 DIAGNOSIS — M25512 Pain in left shoulder: Secondary | ICD-10-CM | POA: Diagnosis not present

## 2016-02-24 DIAGNOSIS — M79641 Pain in right hand: Secondary | ICD-10-CM | POA: Insufficient documentation

## 2016-02-24 DIAGNOSIS — F1120 Opioid dependence, uncomplicated: Secondary | ICD-10-CM | POA: Diagnosis not present

## 2016-02-24 DIAGNOSIS — M19019 Primary osteoarthritis, unspecified shoulder: Secondary | ICD-10-CM | POA: Insufficient documentation

## 2016-02-24 DIAGNOSIS — M25519 Pain in unspecified shoulder: Secondary | ICD-10-CM | POA: Insufficient documentation

## 2016-02-24 DIAGNOSIS — G518 Other disorders of facial nerve: Secondary | ICD-10-CM | POA: Diagnosis not present

## 2016-02-24 DIAGNOSIS — M79642 Pain in left hand: Secondary | ICD-10-CM | POA: Insufficient documentation

## 2016-02-24 DIAGNOSIS — K219 Gastro-esophageal reflux disease without esophagitis: Secondary | ICD-10-CM | POA: Diagnosis not present

## 2016-02-24 DIAGNOSIS — Z85819 Personal history of malignant neoplasm of unspecified site of lip, oral cavity, and pharynx: Secondary | ICD-10-CM | POA: Diagnosis not present

## 2016-02-24 DIAGNOSIS — Z950 Presence of cardiac pacemaker: Secondary | ICD-10-CM | POA: Diagnosis not present

## 2016-02-24 DIAGNOSIS — F419 Anxiety disorder, unspecified: Secondary | ICD-10-CM | POA: Diagnosis not present

## 2016-02-24 DIAGNOSIS — R51 Headache: Secondary | ICD-10-CM | POA: Diagnosis present

## 2016-02-24 DIAGNOSIS — G8929 Other chronic pain: Secondary | ICD-10-CM | POA: Diagnosis not present

## 2016-02-24 DIAGNOSIS — R6884 Jaw pain: Secondary | ICD-10-CM | POA: Diagnosis present

## 2016-02-24 DIAGNOSIS — I10 Essential (primary) hypertension: Secondary | ICD-10-CM | POA: Insufficient documentation

## 2016-02-24 MED ORDER — OXYCODONE HCL 10 MG PO TABS: 10.0000 mg | ORAL_TABLET | Freq: Every day | ORAL | Status: DC
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
## 2016-02-24 NOTE — Progress Notes (Signed)
Safety precautions to be maintained throughout the outpatient stay will include: orient to surroundings, keep bed in low position, maintain call bell within reach at all times, provide assistance with transfer out of bed and ambulation.  

## 2016-02-24 NOTE — Telephone Encounter (Signed)
pt decided to just call and make an appt once he gets his meds filled the second time that away Dr. Andree Elk schedule for May will be ready.Marland KitchenMarland KitchenTD

## 2016-04-05 DIAGNOSIS — I4891 Unspecified atrial fibrillation: Secondary | ICD-10-CM | POA: Insufficient documentation

## 2016-04-05 DIAGNOSIS — I5022 Chronic systolic (congestive) heart failure: Secondary | ICD-10-CM | POA: Insufficient documentation

## 2016-04-25 ENCOUNTER — Encounter: Payer: Medicare Other | Admitting: Anesthesiology

## 2016-05-19 ENCOUNTER — Encounter: Payer: Self-pay | Admitting: Anesthesiology

## 2016-05-19 ENCOUNTER — Ambulatory Visit: Payer: BLUE CROSS/BLUE SHIELD | Attending: Anesthesiology | Admitting: Anesthesiology

## 2016-05-19 VITALS — BP 111/72 | HR 71 | Temp 97.8°F | Resp 18 | Ht 70.0 in | Wt 265.0 lb

## 2016-05-19 DIAGNOSIS — M19019 Primary osteoarthritis, unspecified shoulder: Secondary | ICD-10-CM | POA: Diagnosis not present

## 2016-05-19 DIAGNOSIS — F1120 Opioid dependence, uncomplicated: Secondary | ICD-10-CM | POA: Insufficient documentation

## 2016-05-19 DIAGNOSIS — G8929 Other chronic pain: Secondary | ICD-10-CM | POA: Insufficient documentation

## 2016-05-19 DIAGNOSIS — I10 Essential (primary) hypertension: Secondary | ICD-10-CM | POA: Diagnosis not present

## 2016-05-19 DIAGNOSIS — E785 Hyperlipidemia, unspecified: Secondary | ICD-10-CM | POA: Insufficient documentation

## 2016-05-19 DIAGNOSIS — M25512 Pain in left shoulder: Secondary | ICD-10-CM

## 2016-05-19 DIAGNOSIS — Z85819 Personal history of malignant neoplasm of unspecified site of lip, oral cavity, and pharynx: Secondary | ICD-10-CM | POA: Diagnosis not present

## 2016-05-19 DIAGNOSIS — I509 Heart failure, unspecified: Secondary | ICD-10-CM | POA: Insufficient documentation

## 2016-05-19 DIAGNOSIS — G518 Other disorders of facial nerve: Secondary | ICD-10-CM

## 2016-05-19 DIAGNOSIS — M79642 Pain in left hand: Secondary | ICD-10-CM | POA: Insufficient documentation

## 2016-05-19 DIAGNOSIS — Z950 Presence of cardiac pacemaker: Secondary | ICD-10-CM | POA: Insufficient documentation

## 2016-05-19 DIAGNOSIS — E119 Type 2 diabetes mellitus without complications: Secondary | ICD-10-CM | POA: Diagnosis not present

## 2016-05-19 DIAGNOSIS — M79641 Pain in right hand: Secondary | ICD-10-CM | POA: Insufficient documentation

## 2016-05-19 DIAGNOSIS — M25519 Pain in unspecified shoulder: Secondary | ICD-10-CM | POA: Diagnosis not present

## 2016-05-19 DIAGNOSIS — M545 Low back pain: Secondary | ICD-10-CM | POA: Diagnosis present

## 2016-05-19 DIAGNOSIS — F419 Anxiety disorder, unspecified: Secondary | ICD-10-CM | POA: Insufficient documentation

## 2016-05-19 DIAGNOSIS — K219 Gastro-esophageal reflux disease without esophagitis: Secondary | ICD-10-CM | POA: Diagnosis not present

## 2016-05-19 MED ORDER — OXYCODONE HCL 10 MG PO TABS: 10.0000 mg | ORAL_TABLET | Freq: Every day | ORAL | Status: DC

## 2016-05-19 NOTE — Progress Notes (Signed)
Safety precautions to be maintained throughout the outpatient stay will include: orient to surroundings, keep bed in low position, maintain call bell within reach at all times, provide assistance with transfer out of bed and ambulation.  
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
## 2016-07-06 ENCOUNTER — Telehealth: Payer: Self-pay

## 2016-07-06 NOTE — Telephone Encounter (Signed)
Pt tried calling to schedule an appt for Aug when his meds run out but Dr Andree Elk does not have a schedule for Aug and its 7/12

## 2016-07-21 ENCOUNTER — Encounter: Payer: Self-pay | Admitting: Anesthesiology

## 2016-07-21 ENCOUNTER — Ambulatory Visit: Payer: BLUE CROSS/BLUE SHIELD | Attending: Anesthesiology | Admitting: Anesthesiology

## 2016-07-21 VITALS — BP 127/82 | HR 90 | Temp 97.8°F | Resp 18 | Ht 70.0 in | Wt 260.0 lb

## 2016-07-21 DIAGNOSIS — Z85818 Personal history of malignant neoplasm of other sites of lip, oral cavity, and pharynx: Secondary | ICD-10-CM | POA: Insufficient documentation

## 2016-07-21 DIAGNOSIS — K219 Gastro-esophageal reflux disease without esophagitis: Secondary | ICD-10-CM | POA: Insufficient documentation

## 2016-07-21 DIAGNOSIS — M19019 Primary osteoarthritis, unspecified shoulder: Secondary | ICD-10-CM | POA: Diagnosis not present

## 2016-07-21 DIAGNOSIS — E785 Hyperlipidemia, unspecified: Secondary | ICD-10-CM | POA: Diagnosis not present

## 2016-07-21 DIAGNOSIS — M79642 Pain in left hand: Secondary | ICD-10-CM | POA: Insufficient documentation

## 2016-07-21 DIAGNOSIS — E119 Type 2 diabetes mellitus without complications: Secondary | ICD-10-CM | POA: Insufficient documentation

## 2016-07-21 DIAGNOSIS — G8929 Other chronic pain: Secondary | ICD-10-CM

## 2016-07-21 DIAGNOSIS — M25519 Pain in unspecified shoulder: Secondary | ICD-10-CM | POA: Diagnosis not present

## 2016-07-21 DIAGNOSIS — I10 Essential (primary) hypertension: Secondary | ICD-10-CM | POA: Diagnosis not present

## 2016-07-21 DIAGNOSIS — F1120 Opioid dependence, uncomplicated: Secondary | ICD-10-CM | POA: Insufficient documentation

## 2016-07-21 DIAGNOSIS — G518 Other disorders of facial nerve: Secondary | ICD-10-CM | POA: Diagnosis not present

## 2016-07-21 DIAGNOSIS — I509 Heart failure, unspecified: Secondary | ICD-10-CM | POA: Insufficient documentation

## 2016-07-21 DIAGNOSIS — M25512 Pain in left shoulder: Secondary | ICD-10-CM

## 2016-07-21 DIAGNOSIS — Z7901 Long term (current) use of anticoagulants: Secondary | ICD-10-CM | POA: Insufficient documentation

## 2016-07-21 DIAGNOSIS — M545 Low back pain: Secondary | ICD-10-CM | POA: Diagnosis present

## 2016-07-21 DIAGNOSIS — Z6837 Body mass index (BMI) 37.0-37.9, adult: Secondary | ICD-10-CM | POA: Insufficient documentation

## 2016-07-21 DIAGNOSIS — M79641 Pain in right hand: Secondary | ICD-10-CM | POA: Insufficient documentation

## 2016-07-21 DIAGNOSIS — Z950 Presence of cardiac pacemaker: Secondary | ICD-10-CM | POA: Diagnosis not present

## 2016-07-21 DIAGNOSIS — F419 Anxiety disorder, unspecified: Secondary | ICD-10-CM | POA: Insufficient documentation

## 2016-07-21 MED ORDER — OXYCODONE HCL 10 MG PO TABS: 10.0000 mg | ORAL_TABLET | Freq: Every day | ORAL | 0 refills | Status: DC

## 2016-07-21 NOTE — Patient Instructions (Signed)
You were given 2 prescription for Oxycodone today.

## 2016-07-21 NOTE — Progress Notes (Signed)
Safety precautions to be maintained throughout the outpatient stay will include: orient to surroundings, keep bed in low position, maintain call bell within reach at all times, provide assistance with transfer out of bed and ambulation.  
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
## 2016-07-31 LAB — TOXASSURE SELECT 13 (MW), URINE: PDF: 0

## 2016-09-20 ENCOUNTER — Ambulatory Visit: Payer: BLUE CROSS/BLUE SHIELD | Attending: Anesthesiology | Admitting: Anesthesiology

## 2016-09-20 ENCOUNTER — Encounter: Payer: Self-pay | Admitting: Anesthesiology

## 2016-09-20 VITALS — BP 107/83 | HR 55 | Temp 98.3°F | Resp 18 | Ht 70.0 in | Wt 260.0 lb

## 2016-09-20 DIAGNOSIS — M79642 Pain in left hand: Secondary | ICD-10-CM | POA: Diagnosis not present

## 2016-09-20 DIAGNOSIS — Z85818 Personal history of malignant neoplasm of other sites of lip, oral cavity, and pharynx: Secondary | ICD-10-CM | POA: Insufficient documentation

## 2016-09-20 DIAGNOSIS — G518 Other disorders of facial nerve: Secondary | ICD-10-CM | POA: Diagnosis not present

## 2016-09-20 DIAGNOSIS — I110 Hypertensive heart disease with heart failure: Secondary | ICD-10-CM | POA: Insufficient documentation

## 2016-09-20 DIAGNOSIS — M79641 Pain in right hand: Secondary | ICD-10-CM | POA: Insufficient documentation

## 2016-09-20 DIAGNOSIS — Z950 Presence of cardiac pacemaker: Secondary | ICD-10-CM | POA: Insufficient documentation

## 2016-09-20 DIAGNOSIS — G8929 Other chronic pain: Secondary | ICD-10-CM | POA: Insufficient documentation

## 2016-09-20 DIAGNOSIS — M79602 Pain in left arm: Secondary | ICD-10-CM | POA: Diagnosis not present

## 2016-09-20 DIAGNOSIS — E785 Hyperlipidemia, unspecified: Secondary | ICD-10-CM | POA: Diagnosis not present

## 2016-09-20 DIAGNOSIS — E119 Type 2 diabetes mellitus without complications: Secondary | ICD-10-CM | POA: Diagnosis not present

## 2016-09-20 DIAGNOSIS — I509 Heart failure, unspecified: Secondary | ICD-10-CM | POA: Diagnosis not present

## 2016-09-20 DIAGNOSIS — M25512 Pain in left shoulder: Secondary | ICD-10-CM

## 2016-09-20 DIAGNOSIS — Z7901 Long term (current) use of anticoagulants: Secondary | ICD-10-CM | POA: Diagnosis not present

## 2016-09-20 DIAGNOSIS — Z79891 Long term (current) use of opiate analgesic: Secondary | ICD-10-CM | POA: Diagnosis not present

## 2016-09-20 DIAGNOSIS — F419 Anxiety disorder, unspecified: Secondary | ICD-10-CM | POA: Diagnosis not present

## 2016-09-20 DIAGNOSIS — M545 Low back pain: Secondary | ICD-10-CM | POA: Diagnosis not present

## 2016-09-20 DIAGNOSIS — K219 Gastro-esophageal reflux disease without esophagitis: Secondary | ICD-10-CM | POA: Insufficient documentation

## 2016-09-20 MED ORDER — OXYCODONE HCL 10 MG PO TABS: 10.0000 mg | ORAL_TABLET | Freq: Every day | ORAL | 0 refills | Status: DC

## 2016-09-20 NOTE — Progress Notes (Signed)
Safety precautions to be maintained throughout the outpatient stay will include: orient to surroundings, keep bed in low position, maintain call bell within reach at all times, provide assistance with transfer out of bed and ambulation.  
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
## 2016-11-24 ENCOUNTER — Ambulatory Visit: Payer: BLUE CROSS/BLUE SHIELD | Attending: Anesthesiology | Admitting: Anesthesiology

## 2016-11-24 ENCOUNTER — Encounter: Payer: Self-pay | Admitting: Anesthesiology

## 2016-11-24 VITALS — BP 101/72 | HR 74 | Temp 98.0°F | Resp 16 | Ht 70.0 in | Wt 260.0 lb

## 2016-11-24 DIAGNOSIS — Z7901 Long term (current) use of anticoagulants: Secondary | ICD-10-CM | POA: Diagnosis not present

## 2016-11-24 DIAGNOSIS — M79642 Pain in left hand: Secondary | ICD-10-CM | POA: Insufficient documentation

## 2016-11-24 DIAGNOSIS — Z5189 Encounter for other specified aftercare: Secondary | ICD-10-CM | POA: Diagnosis not present

## 2016-11-24 DIAGNOSIS — M1990 Unspecified osteoarthritis, unspecified site: Secondary | ICD-10-CM | POA: Insufficient documentation

## 2016-11-24 DIAGNOSIS — Z85818 Personal history of malignant neoplasm of other sites of lip, oral cavity, and pharynx: Secondary | ICD-10-CM | POA: Diagnosis not present

## 2016-11-24 DIAGNOSIS — G518 Other disorders of facial nerve: Secondary | ICD-10-CM | POA: Diagnosis not present

## 2016-11-24 DIAGNOSIS — M79641 Pain in right hand: Secondary | ICD-10-CM | POA: Diagnosis not present

## 2016-11-24 DIAGNOSIS — G8929 Other chronic pain: Secondary | ICD-10-CM | POA: Insufficient documentation

## 2016-11-24 DIAGNOSIS — G894 Chronic pain syndrome: Secondary | ICD-10-CM

## 2016-11-24 DIAGNOSIS — Z79899 Other long term (current) drug therapy: Secondary | ICD-10-CM | POA: Insufficient documentation

## 2016-11-24 MED ORDER — OXYCODONE HCL 10 MG PO TABS: 10.0000 mg | ORAL_TABLET | Freq: Every day | ORAL | 0 refills | Status: DC

## 2016-11-24 NOTE — Progress Notes (Signed)
Safety precautions to be maintained throughout the outpatient stay will include: orient to surroundings, keep bed in low position, maintain call bell within reach at all times, provide assistance with transfer out of bed and ambulation.  
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
## 2017-01-17 ENCOUNTER — Ambulatory Visit: Payer: BLUE CROSS/BLUE SHIELD | Attending: Anesthesiology | Admitting: Anesthesiology

## 2017-01-17 ENCOUNTER — Encounter: Payer: Self-pay | Admitting: Anesthesiology

## 2017-01-17 VITALS — BP 98/75 | HR 68 | Temp 98.1°F | Resp 18 | Ht 70.0 in | Wt 265.0 lb

## 2017-01-17 DIAGNOSIS — G894 Chronic pain syndrome: Secondary | ICD-10-CM | POA: Diagnosis not present

## 2017-01-17 DIAGNOSIS — M25512 Pain in left shoulder: Secondary | ICD-10-CM | POA: Insufficient documentation

## 2017-01-17 DIAGNOSIS — G518 Other disorders of facial nerve: Secondary | ICD-10-CM

## 2017-01-17 DIAGNOSIS — M79642 Pain in left hand: Secondary | ICD-10-CM | POA: Insufficient documentation

## 2017-01-17 DIAGNOSIS — M79641 Pain in right hand: Secondary | ICD-10-CM

## 2017-01-17 MED ORDER — ROPIVACAINE HCL 2 MG/ML IJ SOLN: 1.0000 mL | Freq: Once | INTRAMUSCULAR | Status: AC

## 2017-01-17 MED ORDER — DEXAMETHASONE SODIUM PHOSPHATE 4 MG/ML IJ SOLN
INTRAMUSCULAR | Status: AC
  Administered 2017-01-17: 16:00:00
  Filled 2017-01-17: qty 2

## 2017-01-17 MED ORDER — DEXAMETHASONE SODIUM PHOSPHATE 10 MG/ML IJ SOLN
10.0000 mg | Freq: Once | INTRAMUSCULAR | Status: DC
  Filled 2017-01-17: qty 1

## 2017-01-17 MED ORDER — OXYCODONE HCL 10 MG PO TABS: 10.0000 mg | ORAL_TABLET | Freq: Every day | ORAL | 0 refills | Status: DC

## 2017-01-17 MED ORDER — ROPIVACAINE HCL 2 MG/ML IJ SOLN
INTRAMUSCULAR | Status: AC
  Administered 2017-01-17: 16:00:00
  Filled 2017-01-17: qty 10

## 2017-01-17 NOTE — Patient Instructions (Signed)
You were given 2 prescriptions for Oxycodone today. 

## 2017-01-17 NOTE — Progress Notes (Signed)
Safety precautions to be maintained throughout the outpatient stay will include: orient to surroundings, keep bed in low position, maintain call bell within reach at all times, provide assistance with transfer out of bed and ambulation.  
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
## 2017-03-21 ENCOUNTER — Encounter: Payer: Self-pay | Admitting: Anesthesiology

## 2017-03-21 ENCOUNTER — Ambulatory Visit: Payer: BLUE CROSS/BLUE SHIELD | Attending: Anesthesiology | Admitting: Anesthesiology

## 2017-03-21 VITALS — BP 109/82 | HR 76 | Temp 98.0°F | Resp 18 | Ht 70.0 in | Wt 265.0 lb

## 2017-03-21 DIAGNOSIS — G518 Other disorders of facial nerve: Secondary | ICD-10-CM

## 2017-03-21 DIAGNOSIS — Z79899 Other long term (current) drug therapy: Secondary | ICD-10-CM | POA: Insufficient documentation

## 2017-03-21 DIAGNOSIS — I110 Hypertensive heart disease with heart failure: Secondary | ICD-10-CM | POA: Diagnosis not present

## 2017-03-21 DIAGNOSIS — G894 Chronic pain syndrome: Secondary | ICD-10-CM | POA: Diagnosis present

## 2017-03-21 DIAGNOSIS — Z7901 Long term (current) use of anticoagulants: Secondary | ICD-10-CM | POA: Insufficient documentation

## 2017-03-21 DIAGNOSIS — Z7982 Long term (current) use of aspirin: Secondary | ICD-10-CM | POA: Diagnosis not present

## 2017-03-21 DIAGNOSIS — M79641 Pain in right hand: Secondary | ICD-10-CM | POA: Diagnosis not present

## 2017-03-21 DIAGNOSIS — I509 Heart failure, unspecified: Secondary | ICD-10-CM | POA: Insufficient documentation

## 2017-03-21 DIAGNOSIS — Z79891 Long term (current) use of opiate analgesic: Secondary | ICD-10-CM | POA: Diagnosis not present

## 2017-03-21 DIAGNOSIS — E119 Type 2 diabetes mellitus without complications: Secondary | ICD-10-CM | POA: Insufficient documentation

## 2017-03-21 DIAGNOSIS — K219 Gastro-esophageal reflux disease without esophagitis: Secondary | ICD-10-CM | POA: Insufficient documentation

## 2017-03-21 DIAGNOSIS — M79642 Pain in left hand: Secondary | ICD-10-CM | POA: Diagnosis not present

## 2017-03-21 DIAGNOSIS — M25512 Pain in left shoulder: Secondary | ICD-10-CM | POA: Diagnosis not present

## 2017-03-21 DIAGNOSIS — M545 Low back pain: Secondary | ICD-10-CM | POA: Insufficient documentation

## 2017-03-21 DIAGNOSIS — F419 Anxiety disorder, unspecified: Secondary | ICD-10-CM | POA: Diagnosis not present

## 2017-03-21 MED ORDER — OXYCODONE HCL 10 MG PO TABS: 10.0000 mg | ORAL_TABLET | Freq: Every day | ORAL | 0 refills | Status: DC
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
## 2017-03-23 LAB — TOXASSURE SELECT 13 (MW), URINE

## 2017-05-17 ENCOUNTER — Ambulatory Visit: Payer: BLUE CROSS/BLUE SHIELD | Attending: Anesthesiology | Admitting: Nurse Practitioner

## 2017-05-17 ENCOUNTER — Encounter: Payer: Self-pay | Admitting: Nurse Practitioner

## 2017-05-17 VITALS — BP 126/65 | HR 78 | Temp 97.8°F | Resp 18 | Ht 70.0 in | Wt 270.0 lb

## 2017-05-17 DIAGNOSIS — M7918 Myalgia, other site: Secondary | ICD-10-CM

## 2017-05-17 DIAGNOSIS — R531 Weakness: Secondary | ICD-10-CM | POA: Insufficient documentation

## 2017-05-17 DIAGNOSIS — F419 Anxiety disorder, unspecified: Secondary | ICD-10-CM | POA: Insufficient documentation

## 2017-05-17 DIAGNOSIS — E119 Type 2 diabetes mellitus without complications: Secondary | ICD-10-CM | POA: Insufficient documentation

## 2017-05-17 DIAGNOSIS — M791 Myalgia: Secondary | ICD-10-CM

## 2017-05-17 DIAGNOSIS — Z79899 Other long term (current) drug therapy: Secondary | ICD-10-CM | POA: Diagnosis not present

## 2017-05-17 DIAGNOSIS — I110 Hypertensive heart disease with heart failure: Secondary | ICD-10-CM | POA: Insufficient documentation

## 2017-05-17 DIAGNOSIS — M79642 Pain in left hand: Secondary | ICD-10-CM | POA: Insufficient documentation

## 2017-05-17 DIAGNOSIS — I509 Heart failure, unspecified: Secondary | ICD-10-CM | POA: Diagnosis not present

## 2017-05-17 DIAGNOSIS — M75102 Unspecified rotator cuff tear or rupture of left shoulder, not specified as traumatic: Secondary | ICD-10-CM | POA: Insufficient documentation

## 2017-05-17 DIAGNOSIS — I482 Chronic atrial fibrillation: Secondary | ICD-10-CM | POA: Diagnosis not present

## 2017-05-17 DIAGNOSIS — G894 Chronic pain syndrome: Secondary | ICD-10-CM | POA: Insufficient documentation

## 2017-05-17 DIAGNOSIS — G8929 Other chronic pain: Secondary | ICD-10-CM | POA: Insufficient documentation

## 2017-05-17 DIAGNOSIS — M25512 Pain in left shoulder: Secondary | ICD-10-CM | POA: Diagnosis not present

## 2017-05-17 DIAGNOSIS — S42002A Fracture of unspecified part of left clavicle, initial encounter for closed fracture: Secondary | ICD-10-CM | POA: Diagnosis not present

## 2017-05-17 DIAGNOSIS — K219 Gastro-esophageal reflux disease without esophagitis: Secondary | ICD-10-CM | POA: Diagnosis not present

## 2017-05-17 DIAGNOSIS — Z7901 Long term (current) use of anticoagulants: Secondary | ICD-10-CM | POA: Diagnosis not present

## 2017-05-17 DIAGNOSIS — Z87891 Personal history of nicotine dependence: Secondary | ICD-10-CM | POA: Insufficient documentation

## 2017-05-17 DIAGNOSIS — E785 Hyperlipidemia, unspecified: Secondary | ICD-10-CM | POA: Insufficient documentation

## 2017-05-17 DIAGNOSIS — Z950 Presence of cardiac pacemaker: Secondary | ICD-10-CM | POA: Insufficient documentation

## 2017-05-17 DIAGNOSIS — M79641 Pain in right hand: Secondary | ICD-10-CM | POA: Diagnosis not present

## 2017-05-17 MED ORDER — OXYCODONE HCL 10 MG PO TABS: 10.0000 mg | ORAL_TABLET | Freq: Every day | ORAL | 0 refills | Status: DC

## 2017-05-17 NOTE — Patient Instructions (Addendum)
Meds  The patient @[facility] @  Current Outpatient Prescriptions on File Prior to Visit  Medication Sig  . digoxin (LANOXIN) 0.25 MG tablet Take 0.25 mg by mouth daily.  . fenofibrate (TRICOR) 145 MG tablet Take 145 mg by mouth daily.  . lansoprazole (PREVACID) 30 MG capsule Take 30 mg by mouth daily.  Marland Kitchen linagliptin (TRADJENTA) 5 MG TABS tablet Take 5 mg by mouth daily.  . metoprolol (LOPRESSOR) 50 MG tablet Take 75 mg by mouth 2 (two) times daily.  . Oxycodone HCl 10 MG TABS Take 1 tablet (10 mg total) by mouth 5 (five) times daily.  . quinapril (ACCUPRIL) 10 MG tablet Take 10 mg by mouth daily.  . simvastatin (ZOCOR) 40 MG tablet Take 40 mg by mouth daily.   Marland Kitchen spironolactone (ALDACTONE) 25 MG tablet Take 25 mg by mouth daily.  Marland Kitchen venlafaxine (EFFEXOR) 75 MG tablet Take 75 mg by mouth daily.  Marland Kitchen warfarin (COUMADIN) 5 MG tablet Take 5 mg by mouth daily.    Current Facility-Administered Medications on File Prior to Visit  Medication  . dexamethasone (DECADRON) injection 10 mg  . ropivacaine (PF) 2 mg/mL (0.2%) (NAROPIN) injection 1 mL  ____________________________________________________________________________________________  Pain Scale  Introduction: The pain score used by this practice is the Verbal Numerical Rating Scale (VNRS-11). This is an 11-point scale. It is for adults and children 10 years or older. There are significant differences in how the pain score is reported, used, and applied. Forget everything you learned in the past and learn this scoring system.  General Information: The scale should reflect your current level of pain. Unless you are specifically asked for the level of your worst pain, or your average pain. If you are asked for one of these two, then it should be understood that it is over the past 24 hours.  Basic Activities of Daily Living (ADL): Personal hygiene, dressing, eating, transferring, and using restroom.  Instructions: Most patients tend to report their  level of pain as a combination of two factors, their physical pain and their psychosocial pain. This last one is also known as "suffering" and it is reflection of how physical pain affects you socially and psychologically. From now on, report them separately. From this point on, when asked to report your pain level, report only your physical pain. Use the following table for reference.  Pain Clinic Pain Levels (0-5/10)  Pain Level Score  Description  No Pain 0   Mild pain 1 Nagging, annoying, but does not interfere with basic activities of daily living (ADL). Patients are able to eat, bathe, get dressed, toileting (being able to get on and off the toilet and perform personal hygiene functions), transfer (move in and out of bed or a chair without assistance), and maintain continence (able to control bladder and bowel functions). Blood pressure and heart rate are unaffected. A normal heart rate for a healthy adult ranges from 60 to 100 bpm (beats per minute).   Mild to moderate pain 2 Noticeable and distracting. Impossible to hide from other people. More frequent flare-ups. Still possible to adapt and function close to normal. It can be very annoying and may have occasional stronger flare-ups. With discipline, patients may get used to it and adapt.   Moderate pain 3 Interferes significantly with activities of daily living (ADL). It becomes difficult to feed, bathe, get dressed, get on and off the toilet or to perform personal hygiene functions. Difficult to get in and out of bed or a chair without  assistance. Very distracting. With effort, it can be ignored when deeply involved in activities.   Moderately severe pain 4 Impossible to ignore for more than a few minutes. With effort, patients may still be able to manage work or participate in some social activities. Very difficult to concentrate. Signs of autonomic nervous system discharge are evident: dilated pupils (mydriasis); mild sweating (diaphoresis);  sleep interference. Heart rate becomes elevated (>115 bpm). Diastolic blood pressure (lower number) rises above 100 mmHg. Patients find relief in laying down and not moving.   Severe pain 5 Intense and extremely unpleasant. Associated with frowning face and frequent crying. Pain overwhelms the senses.  Ability to do any activity or maintain social relationships becomes significantly limited. Conversation becomes difficult. Pacing back and forth is common, as getting into a comfortable position is nearly impossible. Pain wakes you up from deep sleep. Physical signs will be obvious: pupillary dilation; increased sweating; goosebumps; brisk reflexes; cold, clammy hands and feet; nausea, vomiting or dry heaves; loss of appetite; significant sleep disturbance with inability to fall asleep or to remain asleep. When persistent, significant weight loss is observed due to the complete loss of appetite and sleep deprivation.  Blood pressure and heart rate becomes significantly elevated. Caution: If elevated blood pressure triggers a pounding headache associated with blurred vision, then the patient should immediately seek attention at an urgent or emergency care unit, as these may be signs of an impending stroke.    Emergency Department Pain Levels (6-10/10)  Emergency Room Pain 6 Severely limiting. Requires emergency care and should not be seen or managed at an outpatient pain management facility. Communication becomes difficult and requires great effort. Assistance to reach the emergency department may be required. Facial flushing and profuse sweating along with potentially dangerous increases in heart rate and blood pressure will be evident.   Distressing pain 7 Self-care is very difficult. Assistance is required to transport, or use restroom. Assistance to reach the emergency department will be required. Tasks requiring coordination, such as bathing and getting dressed become very difficult.   Disabling pain 8  Self-care is no longer possible. At this level, pain is disabling. The individual is unable to do even the most "basic" activities such as walking, eating, bathing, dressing, transferring to a bed, or toileting. Fine motor skills are lost. It is difficult to think clearly.   Incapacitating pain 9 Pain becomes incapacitating. Thought processing is no longer possible. Difficult to remember your own name. Control of movement and coordination are lost.   The worst pain imaginable 10 At this level, most patients pass out from pain. When this level is reached, collapse of the autonomic nervous system occurs, leading to a sudden drop in blood pressure and heart rate. This in turn results in a temporary and dramatic drop in blood flow to the brain, leading to a loss of consciousness. Fainting is one of the body's self defense mechanisms. Passing out puts the brain in a calmed state and causes it to shut down for a while, in order to begin the healing process.    Summary: 1. Refer to this scale when providing [place] with your pain level. 2. Be accurate and careful when reporting your pain level. This will help with your care. 3. Over-reporting your pain level will lead to loss of credibility. 4. Even a level of 1/10 means that there is pain and will be treated at our facility. 5. High, inaccurate reporting will be documented as "Symptom Exaggeration", leading to loss of credibility  and suspicions of possible secondary gains such as obtaining more narcotics, or wanting to appear disabled, for fraudulent reasons. 6. Only pain levels of 5 or below will be seen at our facility. 7. Pain levels of 6 and above will be sent to the Emergency Department and the appointment cancelled. _____________________________________________________________________________________________  ____________________________________________________________________________________________  Medication Rules  Applies to: All patients  receiving prescriptions (written or electronic).  Pharmacy of record: Pharmacy where electronic prescriptions will be sent. If written prescriptions are taken to a different pharmacy, please inform the nursing staff. The pharmacy listed in the electronic medical record should be the one where you would like electronic prescriptions to be sent.  Prescription refills: Only during scheduled appointments. Applies to both, written and electronic prescriptions.  NOTE: The following applies primarily to controlled substances (Opioid Pain Medications)  Patient's responsibilities: 1. Pain Pills: Bring all pain pills to every appointment (except for procedure appointments). 2. Pill Bottles: Bring pills in original pharmacy bottle. Always bring newest bottle. Bring bottle, even if empty. 3. Medication refills: You are responsible for knowing and keeping track of what medications you need refilled. The day before your appointment, write a list of all prescriptions that need to be refilled. Bring that list to your appointment and give it to the admitting nurse. Prescriptions will be written only during appointments. If you forget a medication, it will not be "Called in", "Faxed", or "electronically sent". You will need to get another appointment to get these prescribed. 4. Prescription Accuracy: You are responsible for carefully inspecting your prescriptions before leaving our office. Have the discharge nurse carefully go over each prescription with you, before taking them home. Make sure that your name is accurately spelled, that your address is correct. Check the name and dose of your medication to make sure it is accurate. Check the number of pills, and the written instructions to make sure they are clear and accurate. Make sure that you are given enough medication to last until your next medication refill appointment. 5. Taking Medication: Take medication as prescribed. Never take more pills than instructed.  Never take medication more frequently than prescribed. Taking less pills or less frequently is permitted and encouraged, when it comes to controlled substances (written prescriptions).  6. Inform other Doctors: Always inform, all of your healthcare providers, of all the medications you take. 7. Pain Medication from other Providers: You are not allowed to accept any additional pain medication from any other Doctor or Healthcare provider. There are two exceptions to this rule. (see below) In the event that you require additional pain medication, you are responsible for notifying us, as stated below. 8. Medication Agreement: You are responsible for carefully reading and following our Medication Agreement. This must be signed before receiving any prescriptions from our practice. Safely store a copy of your signed Agreement. Violations to the Agreement will result in no further prescriptions. (Additional copies of our Medication Agreement are available upon request.) 9. Laws, Rules, & Regulations: All patients are expected to follow all Federal and [employer], Rules, [employer]. Ignorance of the Laws does not constitute a valid excuse.  Exceptions: There are only two exceptions to the rule of not receiving pain medications from other Healthcare Providers. 1. Exception #1 (Emergencies): In the event of an emergency (i.e.: accident requiring emergency care), you are allowed to receive additional pain medication. However, you are responsible for: As soon as you are able, call our office [phone], at any time of the day or night,  and leave a message stating your name, the date and nature of the emergency, and the name and dose of the medication prescribed. In the event that your call is answered by a member of our staff, make sure to document and save the date, time, and the name of the person that took your information.  2. Exception #2 (Planned Surgery): In the event that you are scheduled by  another doctor or dentist to have any type of surgery or procedure, you are allowed (for a period no longer than 30 days), to receive additional pain medication, for the acute post-op pain. However, in this case, you are responsible for picking up a copy of our "Post-op Pain Management for Surgeons" handout, and giving it to your surgeon or dentist. This document is available at our office, and does not require an appointment to obtain it. Simply go to our office during business hours (Monday-Thursday from 8:00 AM to 4:00 PM) (Friday 8:00 AM to 12:00 Noon) or if you have a scheduled appointment with [place], prior to your surgery, and ask for it by name. In addition, you will need to provide [place] with your name, name of your surgeon, type of surgery, and date of procedure or surgery.  _____________________________________________________________________________________________

## 2017-05-17 NOTE — Progress Notes (Signed)
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 activity or maintain social relationships becomes significantly limited. Conversation becomes difficult. Pacing back and forth is common, as getting into a comfortable position is nearly impossible. Pain wakes you up from deep sleep. Physical signs will be obvious: pupillary dilation; increased sweating; goosebumps; brisk reflexes; cold, clammy hands and feet;  nausea, vomiting or dry heaves; loss of appetite; significant sleep disturbance with inability to fall asleep or to remain asleep. When persistent, significant weight loss is observed due to the complete loss of appetite and sleep deprivation.  Blood pressure and heart rate becomes significantly elevated. Caution: If elevated blood pressure triggers a pounding headache associated with blurred vision, then the patient should immediately seek attention at an urgent or emergency care unit, as these may be signs of an impending stroke.    Emergency Department Pain Levels (6-10/10)  Emergency Room Pain 6  Severely limiting. Requires emergency care and should not be seen or managed at an outpatient pain management facility. Communication becomes difficult and requires great effort. Assistance to reach the emergency department may be required. Facial flushing and profuse sweating along with potentially dangerous increases in heart rate and blood pressure will be evident.   Distressing pain 7 Self-care is very difficult. Assistance is required to transport, or use restroom. Assistance to reach the emergency department will be required. Tasks requiring coordination, such as bathing and getting dressed become very difficult.   Disabling pain 8 Self-care is no longer possible. At this level, pain is disabling. The individual is unable to do even the most "basic" activities such as walking, eating, bathing, dressing, transferring to a bed, or toileting. Fine motor skills are lost. It is difficult to think clearly.   Incapacitating pain 9 Pain becomes incapacitating. Thought processing is no longer possible. Difficult to remember your own name. Control of movement and coordination are lost.   The worst pain imaginable 10 At this level, most patients pass out from pain. When this level is reached, collapse of the autonomic nervous system occurs, leading to a sudden drop in blood pressure and heart rate. This in turn results in a temporary and dramatic drop in blood flow to the brain, leading to a loss of consciousness. Fainting is one of the body's self defense mechanisms. Passing out puts the brain in a calmed state and causes it to shut down for a while, in order to begin the healing process.    Summary: 1. Refer to this scale when providing [place] with your pain level. 2. Be accurate and careful when reporting your pain level. This will help with your care. 3. Over-reporting your pain level will lead to loss of credibility. 4. Even a level of 1/10 means that there is pain and will be treated at our  facility. 5. High, inaccurate reporting will be documented as "Symptom Exaggeration", leading to loss of credibility and suspicions of possible secondary gains such as obtaining more narcotics, or wanting to appear disabled, for fraudulent reasons. 6. Only pain levels of 5 or below will be seen at our facility. 7. Pain levels of 6 and above will be sent to the Emergency Department and the appointment cancelled. _____________________________________________________________________________________________  ____________________________________________________________________________________________  Medication Rules  Applies to: All patients receiving prescriptions (written or electronic).  Pharmacy of record: Pharmacy where electronic prescriptions will be sent. If written prescriptions are taken to a different pharmacy, please inform the nursing staff. The pharmacy listed in the electronic medical record should be the one where you would like electronic prescriptions to be sent.  Prescription refills: Only during scheduled appointments. Applies to both, written and electronic prescriptions.  NOTE: The following applies primarily to controlled substances (Opioid Pain Medications)  Patient's responsibilities: 1. Pain Pills: Bring all pain pills to every appointment (except for procedure appointments). 2. Pill Bottles: Bring pills in original pharmacy  bottle. Always bring newest bottle. Bring bottle, even if empty. 3. Medication refills: You are responsible for knowing and keeping track of what medications you need refilled. The day before your appointment, write a list of all prescriptions that need to be refilled. Bring that list to your appointment and give it to the admitting nurse. Prescriptions will be written only during appointments. If you forget a medication, it will not be "Called in", "Faxed", or "electronically sent". You will need to get another appointment to get these  prescribed. 4. Prescription Accuracy: You are responsible for carefully inspecting your prescriptions before leaving our office. Have the discharge nurse carefully go over each prescription with you, before taking them home. Make sure that your name is accurately spelled, that your address is correct. Check the name and dose of your medication to make sure it is accurate. Check the number of pills, and the written instructions to make sure they are clear and accurate. Make sure that you are given enough medication to last until your next medication refill appointment. 5. Taking Medication: Take medication as prescribed. Never take more pills than instructed. Never take medication more frequently than prescribed. Taking less pills or less frequently is permitted and encouraged, when it comes to controlled substances (written prescriptions).  6. Inform other Doctors: Always inform, all of your healthcare providers, of all the medications you take. 7. Pain Medication from other Providers: You are not allowed to accept any additional pain medication from any other Doctor or Healthcare provider. There are two exceptions to this rule. (see below) In the event that you require additional pain medication, you are responsible for notifying us, as stated below. 8. Medication Agreement: You are responsible for carefully reading and following our Medication Agreement. This must be signed before receiving any prescriptions from our practice. Safely store a copy of your signed Agreement. Violations to the Agreement will result in no further prescriptions. (Additional copies of our Medication Agreement are available upon request.) 9. Laws, Rules, & Regulations: All patients are expected to follow all Federal and [employer], Rules, [employer]. Ignorance of the Laws does not constitute a valid excuse.  Exceptions: There are only two exceptions to the rule of not receiving pain medications from other Healthcare  Providers. 1. Exception #1 (Emergencies): In the event of an emergency (i.e.: accident requiring emergency care), you are allowed to receive additional pain medication. However, you are responsible for: As soon as you are able, call our office [phone], at any time of the day or night, and leave a message stating your name, the date and nature of the emergency, and the name and dose of the medication prescribed. In the event that your call is answered by a member of our staff, make sure to document and save the date, time, and the name of the person that took your information.  2. Exception #2 (Planned Surgery): In the event that you are scheduled by another doctor or dentist to have any type of surgery or procedure, you are allowed (for a period no longer than 30 days), to receive additional pain medication, for the acute post-op pain. However, in this case, you are responsible for picking up a copy of our "Post-op Pain Management for Surgeons" handout, and giving it to your surgeon or dentist. This document is available at our office, and does not require an appointment to obtain it. Simply go to our office during business hours (Monday-Thursday from 8:00 AM to 4:00 PM) (Friday 8:00  AM to 12:00 Noon) or if you have a scheduled appointment with [place], prior to your surgery, and ask for it by name. In addition, you will need to provide [place] with your name, name of your surgeon, type of surgery, and date of procedure or surgery.  _____________________________________________________________________________________________

## 2017-05-17 NOTE — Progress Notes (Signed)
Nursing Pain Medication Assessment:  Safety precautions to be maintained throughout the outpatient stay will include: orient to surroundings, keep bed in low position, maintain call bell within reach at all times, provide assistance with transfer out of bed and ambulation.  Medication Inspection Compliance: Mr. Rosenbloom did not comply with our request to bring his pills to be counted. He was reminded that bringing the medication bottles, even when empty, is a requirement.  Medication: None brought in. Pill/Patch Count: None available to be counted. Bottle Appearance: No container available. Did not bring bottle(s) to appointment. Filled Date: N/A Last Medication intake:  Lunch

## 2017-06-20 DIAGNOSIS — M180 Bilateral primary osteoarthritis of first carpometacarpal joints: Secondary | ICD-10-CM | POA: Insufficient documentation

## 2017-06-30 DIAGNOSIS — Z950 Presence of cardiac pacemaker: Secondary | ICD-10-CM | POA: Insufficient documentation

## 2017-07-10 ENCOUNTER — Ambulatory Visit: Payer: BLUE CROSS/BLUE SHIELD | Attending: Anesthesiology | Admitting: Anesthesiology

## 2017-07-10 ENCOUNTER — Encounter: Payer: Self-pay | Admitting: Anesthesiology

## 2017-07-10 VITALS — BP 132/84 | HR 107 | Temp 97.8°F | Resp 16 | Ht 70.0 in | Wt 270.0 lb

## 2017-07-10 DIAGNOSIS — G894 Chronic pain syndrome: Secondary | ICD-10-CM | POA: Insufficient documentation

## 2017-07-10 DIAGNOSIS — M79642 Pain in left hand: Secondary | ICD-10-CM | POA: Diagnosis not present

## 2017-07-10 DIAGNOSIS — M25512 Pain in left shoulder: Secondary | ICD-10-CM | POA: Diagnosis not present

## 2017-07-10 DIAGNOSIS — R51 Headache: Secondary | ICD-10-CM | POA: Diagnosis not present

## 2017-07-10 DIAGNOSIS — G8929 Other chronic pain: Secondary | ICD-10-CM

## 2017-07-10 DIAGNOSIS — M75102 Unspecified rotator cuff tear or rupture of left shoulder, not specified as traumatic: Secondary | ICD-10-CM | POA: Insufficient documentation

## 2017-07-10 DIAGNOSIS — M79641 Pain in right hand: Secondary | ICD-10-CM | POA: Diagnosis not present

## 2017-07-10 DIAGNOSIS — Z79899 Other long term (current) drug therapy: Secondary | ICD-10-CM | POA: Insufficient documentation

## 2017-07-10 DIAGNOSIS — G518 Other disorders of facial nerve: Secondary | ICD-10-CM

## 2017-07-10 MED ORDER — OXYCODONE HCL 10 MG PO TABS: 10.0000 mg | ORAL_TABLET | Freq: Every day | ORAL | 0 refills | Status: DC

## 2017-07-10 NOTE — Progress Notes (Signed)
Nursing Pain Medication Assessment:  Safety precautions to be maintained throughout the outpatient stay will include: orient to surroundings, keep bed in low position, maintain call bell within reach at all times, provide assistance with transfer out of bed and ambulation.  Medication Inspection Compliance: Pill count conducted under aseptic conditions, in front of the patient. Neither the pills nor the bottle was removed from the patient's sight at any time. Once count was completed pills were immediately returned to the patient in their original bottle.  Medication: Oxycodone IR Pill/Patch Count: 58 of 150 pills remain Pill/Patch Appearance: Markings consistent with prescribed medication Bottle Appearance: Standard pharmacy container. Clearly labeled. Filled Date: 06 / 28 / 2018 Last Medication intake:  Today

## 2017-07-10 NOTE — Patient Instructions (Signed)
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
## 2017-08-31 ENCOUNTER — Encounter: Payer: Self-pay | Admitting: Anesthesiology

## 2017-08-31 ENCOUNTER — Ambulatory Visit: Payer: BLUE CROSS/BLUE SHIELD | Attending: Anesthesiology | Admitting: Anesthesiology

## 2017-08-31 VITALS — BP 106/63 | HR 69 | Temp 98.0°F | Resp 18 | Ht 70.0 in | Wt 265.0 lb

## 2017-08-31 DIAGNOSIS — G894 Chronic pain syndrome: Secondary | ICD-10-CM | POA: Diagnosis not present

## 2017-08-31 DIAGNOSIS — Z7902 Long term (current) use of antithrombotics/antiplatelets: Secondary | ICD-10-CM | POA: Diagnosis not present

## 2017-08-31 DIAGNOSIS — E119 Type 2 diabetes mellitus without complications: Secondary | ICD-10-CM | POA: Diagnosis not present

## 2017-08-31 DIAGNOSIS — Z87891 Personal history of nicotine dependence: Secondary | ICD-10-CM | POA: Insufficient documentation

## 2017-08-31 DIAGNOSIS — M791 Myalgia: Secondary | ICD-10-CM | POA: Diagnosis not present

## 2017-08-31 DIAGNOSIS — M7918 Myalgia, other site: Secondary | ICD-10-CM

## 2017-08-31 DIAGNOSIS — G518 Other disorders of facial nerve: Secondary | ICD-10-CM | POA: Diagnosis not present

## 2017-08-31 DIAGNOSIS — R51 Headache: Secondary | ICD-10-CM | POA: Diagnosis not present

## 2017-08-31 DIAGNOSIS — M79641 Pain in right hand: Secondary | ICD-10-CM

## 2017-08-31 DIAGNOSIS — Z9889 Other specified postprocedural states: Secondary | ICD-10-CM | POA: Insufficient documentation

## 2017-08-31 DIAGNOSIS — I482 Chronic atrial fibrillation: Secondary | ICD-10-CM | POA: Insufficient documentation

## 2017-08-31 DIAGNOSIS — M25512 Pain in left shoulder: Secondary | ICD-10-CM

## 2017-08-31 DIAGNOSIS — Z8589 Personal history of malignant neoplasm of other organs and systems: Secondary | ICD-10-CM | POA: Insufficient documentation

## 2017-08-31 DIAGNOSIS — M79642 Pain in left hand: Secondary | ICD-10-CM | POA: Diagnosis not present

## 2017-08-31 DIAGNOSIS — M75102 Unspecified rotator cuff tear or rupture of left shoulder, not specified as traumatic: Secondary | ICD-10-CM | POA: Diagnosis not present

## 2017-08-31 DIAGNOSIS — Z7984 Long term (current) use of oral hypoglycemic drugs: Secondary | ICD-10-CM | POA: Diagnosis not present

## 2017-08-31 DIAGNOSIS — I509 Heart failure, unspecified: Secondary | ICD-10-CM | POA: Insufficient documentation

## 2017-08-31 DIAGNOSIS — G8929 Other chronic pain: Secondary | ICD-10-CM | POA: Diagnosis not present

## 2017-08-31 DIAGNOSIS — Z95810 Presence of automatic (implantable) cardiac defibrillator: Secondary | ICD-10-CM | POA: Diagnosis not present

## 2017-08-31 DIAGNOSIS — Z79891 Long term (current) use of opiate analgesic: Secondary | ICD-10-CM | POA: Diagnosis not present

## 2017-08-31 MED ORDER — OXYCODONE HCL 10 MG PO TABS: 10.0000 mg | ORAL_TABLET | Freq: Every day | ORAL | 0 refills | Status: DC

## 2017-08-31 NOTE — Progress Notes (Signed)
Nursing Pain Medication Assessment:  Safety precautions to be maintained throughout the outpatient stay will include: orient to surroundings, keep bed in low position, maintain call bell within reach at all times, provide assistance with transfer out of bed and ambulation.  Medication Inspection Compliance: Pill count conducted under aseptic conditions, in front of the patient. Neither the pills nor the bottle was removed from the patient's sight at any time. Once count was completed pills were immediately returned to the patient in their original bottle.  Medication: Oxycodone IR Pill/Patch Count: 101 of 150 pills remain Pill/Patch Appearance: Markings consistent with prescribed medication Bottle Appearance: Standard pharmacy container. Clearly labeled. Filled Date: 08 / 02 / 2018 Last Medication intake:  Today

## 2017-08-31 NOTE — Patient Instructions (Signed)
You were given 2 prescriptions for Oxycodone.
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
## 2017-10-03 ENCOUNTER — Ambulatory Visit (INDEPENDENT_AMBULATORY_CARE_PROVIDER_SITE_OTHER): Payer: BLUE CROSS/BLUE SHIELD | Admitting: Licensed Clinical Social Worker

## 2017-10-03 DIAGNOSIS — F4323 Adjustment disorder with mixed anxiety and depressed mood: Secondary | ICD-10-CM | POA: Diagnosis not present
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
## 2017-10-24 ENCOUNTER — Ambulatory Visit (INDEPENDENT_AMBULATORY_CARE_PROVIDER_SITE_OTHER): Payer: BLUE CROSS/BLUE SHIELD | Admitting: Licensed Clinical Social Worker

## 2017-10-24 DIAGNOSIS — F4323 Adjustment disorder with mixed anxiety and depressed mood: Secondary | ICD-10-CM | POA: Diagnosis not present
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
## 2017-10-26 ENCOUNTER — Ambulatory Visit: Payer: BLUE CROSS/BLUE SHIELD | Attending: Anesthesiology | Admitting: Anesthesiology

## 2017-10-26 ENCOUNTER — Encounter: Payer: Self-pay | Admitting: Anesthesiology

## 2017-10-26 VITALS — BP 116/70 | HR 67 | Temp 98.4°F | Resp 18 | Ht 70.0 in | Wt 270.0 lb

## 2017-10-26 DIAGNOSIS — Z79899 Other long term (current) drug therapy: Secondary | ICD-10-CM | POA: Insufficient documentation

## 2017-10-26 DIAGNOSIS — M7918 Myalgia, other site: Secondary | ICD-10-CM | POA: Insufficient documentation

## 2017-10-26 DIAGNOSIS — Z7902 Long term (current) use of antithrombotics/antiplatelets: Secondary | ICD-10-CM | POA: Diagnosis not present

## 2017-10-26 DIAGNOSIS — G518 Other disorders of facial nerve: Secondary | ICD-10-CM | POA: Diagnosis not present

## 2017-10-26 DIAGNOSIS — G894 Chronic pain syndrome: Secondary | ICD-10-CM | POA: Insufficient documentation

## 2017-10-26 DIAGNOSIS — Z7984 Long term (current) use of oral hypoglycemic drugs: Secondary | ICD-10-CM | POA: Diagnosis not present

## 2017-10-26 DIAGNOSIS — M75102 Unspecified rotator cuff tear or rupture of left shoulder, not specified as traumatic: Secondary | ICD-10-CM | POA: Insufficient documentation

## 2017-10-26 DIAGNOSIS — G8929 Other chronic pain: Secondary | ICD-10-CM | POA: Diagnosis not present

## 2017-10-26 DIAGNOSIS — M25512 Pain in left shoulder: Secondary | ICD-10-CM

## 2017-10-26 DIAGNOSIS — R51 Headache: Secondary | ICD-10-CM | POA: Diagnosis not present

## 2017-10-26 DIAGNOSIS — Z79891 Long term (current) use of opiate analgesic: Secondary | ICD-10-CM | POA: Diagnosis not present

## 2017-10-26 DIAGNOSIS — F1190 Opioid use, unspecified, uncomplicated: Secondary | ICD-10-CM

## 2017-10-26 DIAGNOSIS — M79641 Pain in right hand: Secondary | ICD-10-CM | POA: Insufficient documentation

## 2017-10-26 DIAGNOSIS — M79642 Pain in left hand: Secondary | ICD-10-CM | POA: Insufficient documentation

## 2017-10-26 MED ORDER — OXYCODONE HCL 10 MG PO TABS: 10.0000 mg | ORAL_TABLET | Freq: Every day | ORAL | 0 refills | Status: DC

## 2017-10-26 NOTE — Progress Notes (Signed)
Nursing Pain Medication Assessment:  Safety precautions to be maintained throughout the outpatient stay will include: orient to surroundings, keep bed in low position, maintain call bell within reach at all times, provide assistance with transfer out of bed and ambulation.  Medication Inspection Compliance: Pill count conducted under aseptic conditions, in front of the patient. Neither the pills nor the bottle was removed from the patient's sight at any time. Once count was completed pills were immediately returned to the patient in their original bottle.  Medication: Oxycodone IR Pill/Patch Count: 111 of 150 pills remain Pill/Patch Appearance: Markings consistent with prescribed medication Bottle Appearance: Standard pharmacy container. Clearly labeled. Filled Date: 10/26 / 2018 Last Medication intake:  Today

## 2017-10-26 NOTE — Patient Instructions (Signed)
You were given 1 prescription for Oxycodone today.
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
## 2017-11-02 LAB — TOXASSURE SELECT 13 (MW), URINE

## 2017-11-20 ENCOUNTER — Ambulatory Visit (HOSPITAL_COMMUNITY): Payer: Self-pay | Admitting: Licensed Clinical Social Worker

## 2017-11-23 DIAGNOSIS — Z4502 Encounter for adjustment and management of automatic implantable cardiac defibrillator: Secondary | ICD-10-CM | POA: Insufficient documentation

## 2017-11-24 DIAGNOSIS — Z95810 Presence of automatic (implantable) cardiac defibrillator: Secondary | ICD-10-CM | POA: Insufficient documentation

## 2018-01-09 ENCOUNTER — Encounter: Payer: Self-pay | Admitting: Anesthesiology

## 2018-01-09 ENCOUNTER — Other Ambulatory Visit: Payer: Self-pay

## 2018-01-09 ENCOUNTER — Ambulatory Visit: Payer: BLUE CROSS/BLUE SHIELD | Attending: Anesthesiology | Admitting: Anesthesiology

## 2018-01-09 VITALS — BP 91/64 | HR 77 | Temp 98.1°F | Resp 18 | Ht 70.0 in | Wt 270.0 lb

## 2018-01-09 DIAGNOSIS — M542 Cervicalgia: Secondary | ICD-10-CM | POA: Insufficient documentation

## 2018-01-09 DIAGNOSIS — M79642 Pain in left hand: Secondary | ICD-10-CM | POA: Diagnosis not present

## 2018-01-09 DIAGNOSIS — Z7901 Long term (current) use of anticoagulants: Secondary | ICD-10-CM | POA: Insufficient documentation

## 2018-01-09 DIAGNOSIS — G8929 Other chronic pain: Secondary | ICD-10-CM

## 2018-01-09 DIAGNOSIS — Z7984 Long term (current) use of oral hypoglycemic drugs: Secondary | ICD-10-CM | POA: Insufficient documentation

## 2018-01-09 DIAGNOSIS — Z79891 Long term (current) use of opiate analgesic: Secondary | ICD-10-CM | POA: Insufficient documentation

## 2018-01-09 DIAGNOSIS — M792 Neuralgia and neuritis, unspecified: Secondary | ICD-10-CM | POA: Diagnosis not present

## 2018-01-09 DIAGNOSIS — M25511 Pain in right shoulder: Secondary | ICD-10-CM | POA: Insufficient documentation

## 2018-01-09 DIAGNOSIS — M7918 Myalgia, other site: Secondary | ICD-10-CM | POA: Insufficient documentation

## 2018-01-09 DIAGNOSIS — M25512 Pain in left shoulder: Secondary | ICD-10-CM | POA: Diagnosis present

## 2018-01-09 DIAGNOSIS — M79641 Pain in right hand: Secondary | ICD-10-CM | POA: Insufficient documentation

## 2018-01-09 DIAGNOSIS — Z79899 Other long term (current) drug therapy: Secondary | ICD-10-CM | POA: Diagnosis not present

## 2018-01-09 DIAGNOSIS — F1190 Opioid use, unspecified, uncomplicated: Secondary | ICD-10-CM | POA: Diagnosis not present

## 2018-01-09 DIAGNOSIS — R51 Headache: Secondary | ICD-10-CM | POA: Insufficient documentation

## 2018-01-09 DIAGNOSIS — G518 Other disorders of facial nerve: Secondary | ICD-10-CM

## 2018-01-09 DIAGNOSIS — G894 Chronic pain syndrome: Secondary | ICD-10-CM | POA: Diagnosis not present

## 2018-01-09 MED ORDER — OXYCODONE HCL 10 MG PO TABS: 10.0000 mg | ORAL_TABLET | Freq: Every day | ORAL | 0 refills | Status: DC

## 2018-01-09 NOTE — Progress Notes (Deleted)
Nursing Pain Medication Assessment:  Safety precautions to be maintained throughout the outpatient stay will include: orient to surroundings, keep bed in low position, maintain call bell within reach at all times, provide assistance with transfer out of bed and ambulation.  Medication Inspection Compliance: Adam Andrade did not comply with our request to Andrade his pills to be counted. He was reminded that bringing the medication bottles, even when empty, is a requirement.  Medication: None brought in. Pill/Patch Count: None available to be counted. Bottle Appearance: No container available. Did not Andrade bottle(s) to appointment. Filled Date: N/A Last Medication intake:  {Blank single:19197::"Ran out of medicine more than 48 hours ago","Day before yesterday","Yesterday","Today"}
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
## 2018-01-09 NOTE — Progress Notes (Signed)
Nursing Pain Medication Assessment:  Safety precautions to be maintained throughout the outpatient stay will include: orient to surroundings, keep bed in low position, maintain call bell within reach at all times, provide assistance with transfer out of bed and ambulation.  Medication Inspection Compliance: Pill count conducted under aseptic conditions, in front of the patient. Neither the pills nor the bottle was removed from the patient's sight at any time. Once count was completed pills were immediately returned to the patient in their original bottle.  Medication: Oxycodone IR Pill/Patch Count: 43 of 150 pills remain Pill/Patch Appearance: Markings consistent with prescribed medication Bottle Appearance: Standard pharmacy container. Clearly labeled. Filled Date: 12/24/ 2018 Last Medication intake:  Today

## 2018-01-10 ENCOUNTER — Ambulatory Visit: Payer: Medicare Other | Admitting: Anesthesiology

## 2018-02-27 DIAGNOSIS — M654 Radial styloid tenosynovitis [de Quervain]: Secondary | ICD-10-CM | POA: Insufficient documentation

## 2018-03-13 ENCOUNTER — Other Ambulatory Visit: Payer: Self-pay

## 2018-03-13 ENCOUNTER — Encounter: Payer: Self-pay | Admitting: Anesthesiology

## 2018-03-13 ENCOUNTER — Ambulatory Visit: Payer: BLUE CROSS/BLUE SHIELD | Attending: Anesthesiology | Admitting: Anesthesiology

## 2018-03-13 VITALS — Resp 16 | Ht 70.0 in | Wt 272.0 lb

## 2018-03-13 DIAGNOSIS — F1190 Opioid use, unspecified, uncomplicated: Secondary | ICD-10-CM | POA: Diagnosis not present

## 2018-03-13 DIAGNOSIS — M79641 Pain in right hand: Secondary | ICD-10-CM | POA: Diagnosis not present

## 2018-03-13 DIAGNOSIS — Z79899 Other long term (current) drug therapy: Secondary | ICD-10-CM | POA: Diagnosis not present

## 2018-03-13 DIAGNOSIS — Z7984 Long term (current) use of oral hypoglycemic drugs: Secondary | ICD-10-CM | POA: Insufficient documentation

## 2018-03-13 DIAGNOSIS — R51 Headache: Secondary | ICD-10-CM | POA: Diagnosis not present

## 2018-03-13 DIAGNOSIS — Z79891 Long term (current) use of opiate analgesic: Secondary | ICD-10-CM | POA: Insufficient documentation

## 2018-03-13 DIAGNOSIS — M79642 Pain in left hand: Secondary | ICD-10-CM

## 2018-03-13 DIAGNOSIS — G518 Other disorders of facial nerve: Secondary | ICD-10-CM

## 2018-03-13 DIAGNOSIS — Z7901 Long term (current) use of anticoagulants: Secondary | ICD-10-CM | POA: Diagnosis not present

## 2018-03-13 DIAGNOSIS — G894 Chronic pain syndrome: Secondary | ICD-10-CM

## 2018-03-13 DIAGNOSIS — M25512 Pain in left shoulder: Secondary | ICD-10-CM

## 2018-03-13 DIAGNOSIS — G8929 Other chronic pain: Secondary | ICD-10-CM | POA: Diagnosis not present

## 2018-03-13 MED ORDER — OXYCODONE HCL 10 MG PO TABS: 10.0000 mg | ORAL_TABLET | Freq: Every day | ORAL | 0 refills | Status: DC

## 2018-03-13 NOTE — Progress Notes (Signed)
Nursing Pain Medication Assessment:  Safety precautions to be maintained throughout the outpatient stay will include: orient to surroundings, keep bed in low position, maintain call bell within reach at all times, provide assistance with transfer out of bed and ambulation.  Medication Inspection Compliance: Pill count conducted under aseptic conditions, in front of the patient. Neither the pills nor the bottle was removed from the patient's sight at any time. Once count was completed pills were immediately returned to the patient in their original bottle.  Medication: See above Pill/Patch Count: 29 of 150 pills remain Pill/Patch Appearance: Markings consistent with prescribed medication Bottle Appearance: Standard pharmacy container. Clearly labeled. Filled Date: 02 / 23 / 2019 Last Medication intake:  Today
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
## 2018-03-30 ENCOUNTER — Other Ambulatory Visit: Payer: Self-pay | Admitting: Orthopedic Surgery

## 2018-04-19 ENCOUNTER — Ambulatory Visit (HOSPITAL_COMMUNITY)
Admission: RE | Admit: 2018-04-19 | Payer: BLUE CROSS/BLUE SHIELD | Source: Ambulatory Visit | Admitting: Orthopedic Surgery

## 2018-04-19 ENCOUNTER — Encounter (HOSPITAL_COMMUNITY): Admission: RE | Payer: Self-pay | Source: Ambulatory Visit

## 2018-04-19 SURGERY — CARPOMETACARPEL (CMC) SUSPENSION PLASTY
Anesthesia: General | Laterality: Left

## 2018-05-11 ENCOUNTER — Encounter: Payer: Self-pay | Admitting: Anesthesiology

## 2018-05-11 ENCOUNTER — Ambulatory Visit: Payer: BLUE CROSS/BLUE SHIELD | Attending: Anesthesiology | Admitting: Anesthesiology

## 2018-05-11 ENCOUNTER — Other Ambulatory Visit: Payer: Self-pay

## 2018-05-11 VITALS — BP 80/40 | HR 97 | Temp 98.1°F | Resp 18 | Ht 70.0 in | Wt 265.0 lb

## 2018-05-11 DIAGNOSIS — Z79891 Long term (current) use of opiate analgesic: Secondary | ICD-10-CM | POA: Insufficient documentation

## 2018-05-11 DIAGNOSIS — R51 Headache: Secondary | ICD-10-CM | POA: Diagnosis not present

## 2018-05-11 DIAGNOSIS — G518 Other disorders of facial nerve: Secondary | ICD-10-CM | POA: Diagnosis not present

## 2018-05-11 DIAGNOSIS — M25512 Pain in left shoulder: Secondary | ICD-10-CM | POA: Diagnosis not present

## 2018-05-11 DIAGNOSIS — M79642 Pain in left hand: Secondary | ICD-10-CM | POA: Insufficient documentation

## 2018-05-11 DIAGNOSIS — M79641 Pain in right hand: Secondary | ICD-10-CM | POA: Insufficient documentation

## 2018-05-11 DIAGNOSIS — M542 Cervicalgia: Secondary | ICD-10-CM | POA: Insufficient documentation

## 2018-05-11 DIAGNOSIS — F1190 Opioid use, unspecified, uncomplicated: Secondary | ICD-10-CM

## 2018-05-11 DIAGNOSIS — G894 Chronic pain syndrome: Secondary | ICD-10-CM | POA: Diagnosis not present

## 2018-05-11 DIAGNOSIS — G8929 Other chronic pain: Secondary | ICD-10-CM

## 2018-05-11 DIAGNOSIS — Z7984 Long term (current) use of oral hypoglycemic drugs: Secondary | ICD-10-CM | POA: Diagnosis not present

## 2018-05-11 DIAGNOSIS — Z79899 Other long term (current) drug therapy: Secondary | ICD-10-CM | POA: Insufficient documentation

## 2018-05-11 MED ORDER — OXYCODONE HCL 10 MG PO TABS: 10.0000 mg | ORAL_TABLET | Freq: Every day | ORAL | 0 refills | Status: DC

## 2018-05-11 NOTE — Patient Instructions (Signed)
yOu have been given 2 scripts for oxycodone today.

## 2018-05-11 NOTE — Progress Notes (Signed)
Nursing Pain Medication Assessment:  Safety precautions to be maintained throughout the outpatient stay will include: orient to surroundings, keep bed in low position, maintain call bell within reach at all times, provide assistance with transfer out of bed and ambulation.  Medication Inspection Compliance: Mr. Resor did not comply with our request to bring his pills to be counted. He was reminded that bringing the medication bottles, even when empty, is a requirement.  Medication: None brought in. Pill/Patch Count: None available to be counted. Bottle Appearance: No container available. Did not bring bottle(s) to appointment. Filled Date: N/A Last Medication intake:  Today  Instructed to bring pills to all appointments.
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
## 2018-07-11 ENCOUNTER — Encounter: Payer: Self-pay | Admitting: Anesthesiology

## 2018-07-11 ENCOUNTER — Ambulatory Visit: Payer: BLUE CROSS/BLUE SHIELD | Attending: Anesthesiology | Admitting: Anesthesiology

## 2018-07-11 ENCOUNTER — Other Ambulatory Visit: Payer: Self-pay

## 2018-07-11 VITALS — BP 100/77 | HR 50 | Temp 98.7°F | Ht 70.0 in | Wt 260.0 lb

## 2018-07-11 DIAGNOSIS — Z7984 Long term (current) use of oral hypoglycemic drugs: Secondary | ICD-10-CM | POA: Diagnosis not present

## 2018-07-11 DIAGNOSIS — M79642 Pain in left hand: Secondary | ICD-10-CM | POA: Diagnosis not present

## 2018-07-11 DIAGNOSIS — M542 Cervicalgia: Secondary | ICD-10-CM | POA: Diagnosis not present

## 2018-07-11 DIAGNOSIS — G894 Chronic pain syndrome: Secondary | ICD-10-CM

## 2018-07-11 DIAGNOSIS — F1190 Opioid use, unspecified, uncomplicated: Secondary | ICD-10-CM

## 2018-07-11 DIAGNOSIS — Z79899 Other long term (current) drug therapy: Secondary | ICD-10-CM | POA: Diagnosis not present

## 2018-07-11 DIAGNOSIS — Z79891 Long term (current) use of opiate analgesic: Secondary | ICD-10-CM | POA: Diagnosis not present

## 2018-07-11 DIAGNOSIS — Z7901 Long term (current) use of anticoagulants: Secondary | ICD-10-CM | POA: Insufficient documentation

## 2018-07-11 DIAGNOSIS — G8929 Other chronic pain: Secondary | ICD-10-CM

## 2018-07-11 DIAGNOSIS — M25512 Pain in left shoulder: Secondary | ICD-10-CM | POA: Diagnosis not present

## 2018-07-11 DIAGNOSIS — M79641 Pain in right hand: Secondary | ICD-10-CM | POA: Diagnosis not present

## 2018-07-11 DIAGNOSIS — G518 Other disorders of facial nerve: Secondary | ICD-10-CM

## 2018-07-11 MED ORDER — OXYCODONE HCL 10 MG PO TABS: 10.0000 mg | ORAL_TABLET | Freq: Every day | ORAL | 0 refills | Status: DC

## 2018-07-11 NOTE — Progress Notes (Signed)
Nursing Pain Medication Assessment:  Safety precautions to be maintained throughout the outpatient stay will include: orient to surroundings, keep bed in low position, maintain call bell within reach at all times, provide assistance with transfer out of bed and ambulation.  Medication Inspection Compliance: Pill count conducted under aseptic conditions, in front of the patient. Neither the pills nor the bottle was removed from the patient's sight at any time. Once count was completed pills were immediately returned to the patient in their original bottle.  Medication: Oxycodone IR Pill/Patch Count: 33 of 150 pills remain Pill/Patch Appearance: Markings consistent with prescribed medication Bottle Appearance: Standard pharmacy container. Clearly labeled. Filled Date: 6 / 22 / 2019 Last Medication intake:  Today
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
## 2018-07-17 DIAGNOSIS — N179 Acute kidney failure, unspecified: Secondary | ICD-10-CM | POA: Insufficient documentation

## 2018-07-17 DIAGNOSIS — I340 Nonrheumatic mitral (valve) insufficiency: Secondary | ICD-10-CM | POA: Insufficient documentation

## 2018-07-25 DIAGNOSIS — R7989 Other specified abnormal findings of blood chemistry: Secondary | ICD-10-CM | POA: Insufficient documentation

## 2018-07-25 DIAGNOSIS — G4733 Obstructive sleep apnea (adult) (pediatric): Secondary | ICD-10-CM | POA: Insufficient documentation

## 2018-07-25 DIAGNOSIS — I493 Ventricular premature depolarization: Secondary | ICD-10-CM | POA: Insufficient documentation

## 2018-09-11 ENCOUNTER — Ambulatory Visit: Payer: BLUE CROSS/BLUE SHIELD | Attending: Anesthesiology | Admitting: Anesthesiology

## 2018-09-11 ENCOUNTER — Encounter: Payer: Self-pay | Admitting: Anesthesiology

## 2018-09-11 ENCOUNTER — Other Ambulatory Visit: Payer: Self-pay

## 2018-09-11 VITALS — BP 111/75 | HR 89 | Temp 97.6°F | Resp 18 | Ht 70.0 in | Wt 256.0 lb

## 2018-09-11 DIAGNOSIS — Z79891 Long term (current) use of opiate analgesic: Secondary | ICD-10-CM | POA: Insufficient documentation

## 2018-09-11 DIAGNOSIS — Z7901 Long term (current) use of anticoagulants: Secondary | ICD-10-CM | POA: Diagnosis not present

## 2018-09-11 DIAGNOSIS — G518 Other disorders of facial nerve: Secondary | ICD-10-CM | POA: Diagnosis not present

## 2018-09-11 DIAGNOSIS — M7918 Myalgia, other site: Secondary | ICD-10-CM

## 2018-09-11 DIAGNOSIS — M79642 Pain in left hand: Secondary | ICD-10-CM | POA: Diagnosis not present

## 2018-09-11 DIAGNOSIS — Z760 Encounter for issue of repeat prescription: Secondary | ICD-10-CM | POA: Diagnosis not present

## 2018-09-11 DIAGNOSIS — M542 Cervicalgia: Secondary | ICD-10-CM | POA: Diagnosis not present

## 2018-09-11 DIAGNOSIS — Z7984 Long term (current) use of oral hypoglycemic drugs: Secondary | ICD-10-CM | POA: Insufficient documentation

## 2018-09-11 DIAGNOSIS — G894 Chronic pain syndrome: Secondary | ICD-10-CM

## 2018-09-11 DIAGNOSIS — R51 Headache: Secondary | ICD-10-CM | POA: Diagnosis not present

## 2018-09-11 DIAGNOSIS — M79641 Pain in right hand: Secondary | ICD-10-CM | POA: Diagnosis not present

## 2018-09-11 DIAGNOSIS — M25512 Pain in left shoulder: Secondary | ICD-10-CM

## 2018-09-11 DIAGNOSIS — F1190 Opioid use, unspecified, uncomplicated: Secondary | ICD-10-CM

## 2018-09-11 DIAGNOSIS — G8929 Other chronic pain: Secondary | ICD-10-CM

## 2018-09-11 DIAGNOSIS — Z79899 Other long term (current) drug therapy: Secondary | ICD-10-CM | POA: Insufficient documentation

## 2018-09-11 MED ORDER — OXYCODONE HCL 10 MG PO TABS: 10.0000 mg | ORAL_TABLET | Freq: Every day | ORAL | 0 refills | Status: DC
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
## 2018-09-11 NOTE — Progress Notes (Signed)
Nursing Pain Medication Assessment:  Safety precautions to be maintained throughout the outpatient stay will include: orient to surroundings, keep bed in low position, maintain call bell within reach at all times, provide assistance with transfer out of bed and ambulation.  Medication Inspection Compliance: Pill count conducted under aseptic conditions, in front of the patient. Neither the pills nor the bottle was removed from the patient's sight at any time. Once count was completed pills were immediately returned to the patient in their original bottle.  Medication: Oxycodone HCL Pill/Patch Count: 27 of 150 pills remain Pill/Patch Appearance: Markings consistent with prescribed medication Bottle Appearance: Standard pharmacy container. Clearly labeled. Filled Date: 08/ 22 / 2019 Last Medication intake:  Today

## 2018-09-11 NOTE — Patient Instructions (Signed)
You were given 3 prescriotions for Oxycodone today.

## 2018-10-05 DIAGNOSIS — Z7901 Long term (current) use of anticoagulants: Secondary | ICD-10-CM | POA: Insufficient documentation

## 2018-10-05 DIAGNOSIS — Z79899 Other long term (current) drug therapy: Secondary | ICD-10-CM | POA: Insufficient documentation

## 2018-11-13 ENCOUNTER — Encounter: Payer: Self-pay | Admitting: Anesthesiology

## 2018-11-13 ENCOUNTER — Ambulatory Visit: Payer: BLUE CROSS/BLUE SHIELD | Attending: Anesthesiology | Admitting: Anesthesiology

## 2018-11-13 ENCOUNTER — Other Ambulatory Visit: Payer: Self-pay

## 2018-11-13 VITALS — BP 95/74 | HR 92 | Temp 97.9°F | Resp 20 | Ht 70.0 in | Wt 265.0 lb

## 2018-11-13 DIAGNOSIS — G518 Other disorders of facial nerve: Secondary | ICD-10-CM

## 2018-11-13 DIAGNOSIS — Z791 Long term (current) use of non-steroidal anti-inflammatories (NSAID): Secondary | ICD-10-CM | POA: Insufficient documentation

## 2018-11-13 DIAGNOSIS — M542 Cervicalgia: Secondary | ICD-10-CM | POA: Diagnosis not present

## 2018-11-13 DIAGNOSIS — M545 Low back pain, unspecified: Secondary | ICD-10-CM

## 2018-11-13 DIAGNOSIS — Z79891 Long term (current) use of opiate analgesic: Secondary | ICD-10-CM | POA: Diagnosis not present

## 2018-11-13 DIAGNOSIS — M79641 Pain in right hand: Secondary | ICD-10-CM | POA: Diagnosis not present

## 2018-11-13 DIAGNOSIS — Z7901 Long term (current) use of anticoagulants: Secondary | ICD-10-CM | POA: Insufficient documentation

## 2018-11-13 DIAGNOSIS — Z7984 Long term (current) use of oral hypoglycemic drugs: Secondary | ICD-10-CM | POA: Diagnosis not present

## 2018-11-13 DIAGNOSIS — Z79899 Other long term (current) drug therapy: Secondary | ICD-10-CM | POA: Insufficient documentation

## 2018-11-13 DIAGNOSIS — R51 Headache: Secondary | ICD-10-CM | POA: Diagnosis not present

## 2018-11-13 DIAGNOSIS — M7918 Myalgia, other site: Secondary | ICD-10-CM | POA: Insufficient documentation

## 2018-11-13 DIAGNOSIS — G894 Chronic pain syndrome: Secondary | ICD-10-CM | POA: Insufficient documentation

## 2018-11-13 DIAGNOSIS — M25512 Pain in left shoulder: Secondary | ICD-10-CM | POA: Insufficient documentation

## 2018-11-13 DIAGNOSIS — G8929 Other chronic pain: Secondary | ICD-10-CM

## 2018-11-13 DIAGNOSIS — M79642 Pain in left hand: Secondary | ICD-10-CM | POA: Diagnosis not present

## 2018-11-13 DIAGNOSIS — F1190 Opioid use, unspecified, uncomplicated: Secondary | ICD-10-CM

## 2018-11-13 MED ORDER — OXYCODONE HCL 10 MG PO TABS: 10.0000 mg | ORAL_TABLET | Freq: Every day | ORAL | 0 refills | Status: DC

## 2018-11-13 NOTE — Progress Notes (Signed)
Nursing Pain Medication Assessment:  Safety precautions to be maintained throughout the outpatient stay will include: orient to surroundings, keep bed in low position, maintain call bell within reach at all times, provide assistance with transfer out of bed and ambulation.  Medication Inspection Compliance: Pill count conducted under aseptic conditions, in front of the patient. Neither the pills nor the bottle was removed from the patient's sight at any time. Once count was completed pills were immediately returned to the patient in their original bottle.  Medication: Oxycodone IR Pill/Patch Count: 10 of 150 pills remain Pill/Patch Appearance: Markings consistent with prescribed medication Bottle Appearance: Standard pharmacy container. Clearly labeled. Filled Date: 10 / 21 / 2019 Last Medication intake:  Today

## 2018-11-13 NOTE — Patient Instructions (Signed)
You have been handed 2 scripts for oxycodone today.
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
## 2019-01-07 ENCOUNTER — Encounter: Payer: Self-pay | Admitting: Anesthesiology

## 2019-01-07 ENCOUNTER — Ambulatory Visit: Payer: BLUE CROSS/BLUE SHIELD | Attending: Anesthesiology | Admitting: Anesthesiology

## 2019-01-07 ENCOUNTER — Other Ambulatory Visit: Payer: Self-pay

## 2019-01-07 VITALS — BP 92/66 | HR 91 | Temp 97.8°F | Resp 16 | Ht 70.0 in | Wt 260.0 lb

## 2019-01-07 DIAGNOSIS — G518 Other disorders of facial nerve: Secondary | ICD-10-CM | POA: Diagnosis not present

## 2019-01-07 DIAGNOSIS — G8929 Other chronic pain: Secondary | ICD-10-CM

## 2019-01-07 DIAGNOSIS — M79642 Pain in left hand: Secondary | ICD-10-CM

## 2019-01-07 DIAGNOSIS — M79641 Pain in right hand: Secondary | ICD-10-CM | POA: Diagnosis present

## 2019-01-07 DIAGNOSIS — M545 Low back pain, unspecified: Secondary | ICD-10-CM

## 2019-01-07 DIAGNOSIS — M542 Cervicalgia: Secondary | ICD-10-CM

## 2019-01-07 DIAGNOSIS — G894 Chronic pain syndrome: Secondary | ICD-10-CM

## 2019-01-07 DIAGNOSIS — F1190 Opioid use, unspecified, uncomplicated: Secondary | ICD-10-CM | POA: Diagnosis present

## 2019-01-07 DIAGNOSIS — M7918 Myalgia, other site: Secondary | ICD-10-CM

## 2019-01-07 DIAGNOSIS — M25512 Pain in left shoulder: Secondary | ICD-10-CM

## 2019-01-07 MED ORDER — OXYCODONE HCL 10 MG PO TABS: 10.0000 mg | ORAL_TABLET | Freq: Every day | ORAL | 0 refills | Status: DC | PRN

## 2019-01-07 MED ORDER — OXYCODONE HCL 10 MG PO TABS: 10.0000 mg | ORAL_TABLET | Freq: Every day | ORAL | 0 refills | Status: DC

## 2019-01-07 NOTE — Patient Instructions (Signed)
____________________________________________________________________________________________  Medication Rules  Applies to: All patients receiving prescriptions (written or electronic).  Pharmacy of record: Pharmacy where electronic prescriptions will be sent. If written prescriptions are taken to a different pharmacy, please inform the nursing staff. The pharmacy listed in the electronic medical record should be the one where you would like electronic prescriptions to be sent.  Prescription refills: Only during scheduled appointments. Applies to both, written and electronic prescriptions.  NOTE: The following applies primarily to controlled substances (Opioid* Pain Medications).   Patient's responsibilities: 1. Pain Pills: Bring all pain pills to every appointment (except for procedure appointments). 2. Pill Bottles: Bring pills in original pharmacy bottle. Always bring newest bottle. Bring bottle, even if empty. 3. Medication refills: You are responsible for knowing and keeping track of what medications you need refilled. The day before your appointment, write a list of all prescriptions that need to be refilled. Bring that list to your appointment and give it to the admitting nurse. Prescriptions will be written only during appointments. If you forget a medication, it will not be "Called in", "Faxed", or "electronically sent". You will need to get another appointment to get these prescribed. 4. Prescription Accuracy: You are responsible for carefully inspecting your prescriptions before leaving our office. Have the discharge nurse carefully go over each prescription with you, before taking them home. Make sure that your name is accurately spelled, that your address is correct. Check the name and dose of your medication to make sure it is accurate. Check the number of pills, and the written instructions to make sure they are clear and accurate. Make sure that you are given enough medication to last  until your next medication refill appointment. 5. Taking Medication: Take medication as prescribed. Never take more pills than instructed. Never take medication more frequently than prescribed. Taking less pills or less frequently is permitted and encouraged, when it comes to controlled substances (written prescriptions).  6. Inform other Doctors: Always inform, all of your healthcare providers, of all the medications you take. 7. Pain Medication from other Providers: You are not allowed to accept any additional pain medication from any other Doctor or Healthcare provider. There are two exceptions to this rule. (see below) In the event that you require additional pain medication, you are responsible for notifying us, as stated below. 8. Medication Agreement: You are responsible for carefully reading and following our Medication Agreement. This must be signed before receiving any prescriptions from our practice. Safely store a copy of your signed Agreement. Violations to the Agreement will result in no further prescriptions. (Additional copies of our Medication Agreement are available upon request.) 9. Laws, Rules, & Regulations: All patients are expected to follow all Federal and State Laws, Statutes, Rules, & Regulations. Ignorance of the Laws does not constitute a valid excuse. The use of any illegal substances is prohibited. 10. Adopted CDC guidelines & recommendations: Target dosing levels will be at or below 60 MME/day. Use of benzodiazepines** is not recommended.  Exceptions: There are only two exceptions to the rule of not receiving pain medications from other Healthcare Providers. 1. Exception #1 (Emergencies): In the event of an emergency (i.e.: accident requiring emergency care), you are allowed to receive additional pain medication. However, you are responsible for: As soon as you are able, call our office [phone], at any time of the day or night, and leave a message stating your name, the  date and nature of the emergency, and the name and dose of the medication   prescribed. In the event that your call is answered by a member of our staff, make sure to document and save the date, time, and the name of the person that took your information.  2. Exception #2 (Planned Surgery): In the event that you are scheduled by another doctor or dentist to have any type of surgery or procedure, you are allowed (for a period no longer than 30 days), to receive additional pain medication, for the acute post-op pain. However, in this case, you are responsible for picking up a copy of our "Post-op Pain Management for Surgeons" handout, and giving it to your surgeon or dentist. This document is available at our office, and does not require an appointment to obtain it. Simply go to our office during business hours (Monday-Thursday from 8:00 AM to 4:00 PM) (Friday 8:00 AM to 12:00 Noon) or if you have a scheduled appointment with us, prior to your surgery, and ask for it by name. In addition, you will need to provide us with your name, name of your surgeon, type of surgery, and date of procedure or surgery.  *Opioid medications include: morphine, codeine, oxycodone, oxymorphone, hydrocodone, hydromorphone, meperidine, tramadol, tapentadol, buprenorphine, fentanyl, methadone. **Benzodiazepine medications include: diazepam (Valium), alprazolam (Xanax), clonazepam (Klonopine), lorazepam (Ativan), clorazepate (Tranxene), chlordiazepoxide (Librium), estazolam (Prosom), oxazepam (Serax), temazepam (Restoril), triazolam (Halcion) (Last updated: 02/22/2018) ____________________________________________________________________________________________    

## 2019-01-07 NOTE — Progress Notes (Signed)
Nursing Pain Medication Assessment:  Safety precautions to be maintained throughout the outpatient stay will include: orient to surroundings, keep bed in low position, maintain call bell within reach at all times, provide assistance with transfer out of bed and ambulation.  Medication Inspection Compliance: Pill count conducted under aseptic conditions, in front of the patient. Neither the pills nor the bottle was removed from the patient's sight at any time. Once count was completed pills were immediately returned to the patient in their original bottle.  Medication: Oxycodone IR Pill/Patch Count: 39 of 150 pills remain Pill/Patch Appearance: Markings consistent with prescribed medication Bottle Appearance: Standard pharmacy container. Clearly labeled. Filled Date: 45 / 20 / 2019 Last Medication intake:  Today
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
## 2019-01-13 LAB — TOXASSURE SELECT 13 (MW), URINE

## 2019-01-28 DIAGNOSIS — C4490 Unspecified malignant neoplasm of skin, unspecified: Secondary | ICD-10-CM | POA: Insufficient documentation

## 2019-01-28 DIAGNOSIS — C3490 Malignant neoplasm of unspecified part of unspecified bronchus or lung: Secondary | ICD-10-CM | POA: Insufficient documentation

## 2019-01-31 ENCOUNTER — Other Ambulatory Visit: Payer: Self-pay | Admitting: Orthopedic Surgery

## 2019-02-12 NOTE — Pre-Procedure Instructions (Signed)
Adam Andrade  02/12/2019      [id number] [address], [place] [address] [place] [address] Phone: [phone] Fax: [phone]    Your procedure is scheduled on Feb. 24  Report to [facility] Entr[facility] at 5:30 A.M.  Call this number if you have problems the morning of surgery:  [phone]   Remember:  Do not eat or drink after midnight.      Take these medicines the morning of surgery with A SIP OF WATER :             Amiodarone (pacerone)           Lansoprazole (prevacid)           Levothyroxine (synthroid)           Metoprolol (toprol-xl)           Oxycodone if needed           Venlafaxine (effexor)           7 days prior to surgery STOP taking any Aspirin (unless otherwise instructed by your surgeon), Aleve, Naproxen, Ibuprofen, Motrin, Advil, Goody's, BC's, all herbal medications, fish oil, and all vitamins.              Follow your surgeon's instructions on when to stop eliquis.  If no instructions were given by your surgeon then you will need to call the office to get those instructions.                         How to Manage Your Diabetes Before and After Surgery  Why is it important to control my blood sugar before and after surgery? . Improving blood sugar levels before and after surgery helps healing and can limit problems. . A way of improving blood sugar control is eating a healthy diet by: o  Eating less sugar and carbohydrates o  Increasing activity/exercise o  Talking with your doctor about reaching your blood sugar goals . High blood sugars (greater than 180 mg/dL) can raise your risk of infections and slow your recovery, so you will need to focus on controlling your diabetes during the weeks before surgery. . Make sure that the doctor who takes care of your diabetes knows about your planned surgery including the date and location.  How do I manage my blood sugar before surgery? . Check  your blood sugar at least 4 times a day, starting 2 days before surgery, to make sure that the level is not too high or low. o Check your blood sugar the morning of your surgery when you wake up and every 2 hours until you get to the Short Stay unit. . If your blood sugar is less than 70 mg/dL, you will need to treat for low blood sugar: o Do not take insulin. o Treat a low blood sugar (less than 70 mg/dL) with  cup of clear juice (cranberry or apple), 4 glucose tablets, OR glucose gel. Recheck blood sugar in 15 minutes after treatment (to make sure it is greater than 70 mg/dL). If your blood sugar is not greater than 70 mg/dL on recheck, call [phone] o  for further instructions. . Report your blood sugar to the short stay nurse when you get to Short Stay.  . If you are admitted to the hospital after surgery: o Your blood sugar will be checked by the staff and you will probably  be given insulin after surgery (instead of oral diabetes medicines) to make sure you have good blood sugar levels. o The goal for blood sugar control after surgery is 80-180 mg/dL.        WHAT DO I DO ABOUT MY DIABETES MEDICATION?   Marland Kitchen Do not take oral diabetes medicines (pills) the morning of surgery.  (janumet and tradjenta)                Do not wear jewelry.  Do not wear lotions, powders, or perfumes, or deodorant.  Do not shave 48 hours prior to surgery.  Men may shave face and neck.  Do not bring valuables to the hospital.  [facility] is not responsible for any belongings or valuables.  Contacts, dentures or bridgework may not be worn into surgery.  Leave your suitcase in the car.  After surgery it may be brought to your room.  For patients admitted to the hospital, discharge time will be determined by your treatment team.  Patients discharged the day of surgery will not be allowed to drive home.    Special instructions:   [place]- Preparing For Surgery  Before surgery, you can play an  important role. Because skin is not sterile, your skin needs to be as free of germs as possible. You can reduce the number of germs on your skin by washing with CHG (chlorahexidine gluconate) Soap before surgery.  CHG is an antiseptic cleaner which kills germs and bonds with the skin to continue killing germs even after washing.    Oral Hygiene is also important to reduce your risk of infection.  Remember - BRUSH YOUR TEETH THE MORNING OF SURGERY WITH YOUR REGULAR TOOTHPASTE  Please do not use if you have an allergy to CHG or antibacterial soaps. If your skin becomes reddened/irritated stop using the CHG.  Do not shave (including legs and underarms) for at least 48 hours prior to first CHG shower. It is OK to shave your face.  Please follow these instructions carefully.   1. Shower the NIGHT BEFORE SURGERY and the MORNING OF SURGERY with CHG.   2. If you chose to wash your hair, wash your hair first as usual with your normal shampoo.  3. After you shampoo, rinse your hair and body thoroughly to remove the shampoo.  4. Use CHG as you would any other liquid soap. You can apply CHG directly to the skin and wash gently with a scrungie or a clean washcloth.   5. Apply the CHG Soap to your body ONLY FROM THE NECK DOWN.  Do not use on open wounds or open sores. Avoid contact with your eyes, ears, mouth and genitals (private parts). Wash Face and genitals (private parts)  with your normal soap.  6. Wash thoroughly, paying special attention to the area where your surgery will be performed.  7. Thoroughly rinse your body with warm water from the neck down.  8. DO NOT shower/wash with your normal soap after using and rinsing off the CHG Soap.  9. Pat yourself dry with a CLEAN TOWEL.  10. Wear CLEAN PAJAMAS to bed the night before surgery, wear comfortable clothes the morning of surgery  11. Place CLEAN SHEETS on your bed the night of your first shower and DO NOT SLEEP WITH PETS.    Day of  Surgery:  Do not apply any deodorants/lotions.  Please wear clean clothes to the hospital/surgery center.   Remember to brush your teeth WITH YOUR REGULAR TOOTHPASTE.  Please read over the following fact sheets that you were given. Coughing and Deep Breathing and Surgical Site Infection Prevention

## 2019-02-13 ENCOUNTER — Other Ambulatory Visit: Payer: Self-pay

## 2019-02-13 ENCOUNTER — Encounter (HOSPITAL_COMMUNITY)
Admission: RE | Admit: 2019-02-13 | Discharge: 2019-02-13 | Disposition: A | Discharge status: Home / Self Care | Payer: BLUE CROSS/BLUE SHIELD | Source: Ambulatory Visit | Attending: Orthopedic Surgery | Admitting: Orthopedic Surgery

## 2019-02-13 ENCOUNTER — Encounter (HOSPITAL_COMMUNITY): Payer: Self-pay

## 2019-02-13 DIAGNOSIS — Z01812 Encounter for preprocedural laboratory examination: Secondary | ICD-10-CM | POA: Insufficient documentation

## 2019-02-13 HISTORY — DX: Hypothyroidism, unspecified: E03.9

## 2019-02-13 HISTORY — DX: Sleep apnea, unspecified: G47.30

## 2019-02-13 HISTORY — DX: Presence of automatic (implantable) cardiac defibrillator: Z95.810

## 2019-02-13 HISTORY — DX: Unspecified osteoarthritis, unspecified site: M19.90

## 2019-02-13 HISTORY — DX: Dyspnea, unspecified: R06.00

## 2019-02-13 HISTORY — DX: Gastro-esophageal reflux disease without esophagitis: K21.9

## 2019-02-13 LAB — CBC
HCT: 37.2 % — ABNORMAL LOW (ref 39.0–52.0)
HEMOGLOBIN: 11.8 g/dL — AB (ref 13.0–17.0)
MCH: 30.8 pg (ref 26.0–34.0)
MCHC: 31.7 g/dL (ref 30.0–36.0)
MCV: 97.1 fL (ref 80.0–100.0)
Platelets: 316 10*3/uL (ref 150–400)
RBC: 3.83 MIL/uL — ABNORMAL LOW (ref 4.22–5.81)
RDW: 15.9 % — ABNORMAL HIGH (ref 11.5–15.5)
WBC: 7.5 10*3/uL (ref 4.0–10.5)
nRBC: 0 % (ref 0.0–0.2)

## 2019-02-13 LAB — BASIC METABOLIC PANEL
ANION GAP: 12 (ref 5–15)
BUN: 26 mg/dL — ABNORMAL HIGH (ref 6–20)
CO2: 25 mmol/L (ref 22–32)
Calcium: 9.4 mg/dL (ref 8.9–10.3)
Chloride: 102 mmol/L (ref 98–111)
Creatinine, Ser: 2.08 mg/dL — ABNORMAL HIGH (ref 0.61–1.24)
GFR calc Af Amer: 40 mL/min — ABNORMAL LOW (ref >60)
GFR calc non Af Amer: 34 mL/min — ABNORMAL LOW (ref >60)
Glucose, Bld: 142 mg/dL — ABNORMAL HIGH (ref 70–99)
POTASSIUM: 4.3 mmol/L (ref 3.5–5.1)
Sodium: 139 mmol/L (ref 135–145)

## 2019-02-13 LAB — GLUCOSE, CAPILLARY: Glucose-Capillary: 135 mg/dL — ABNORMAL HIGH (ref 70–99)

## 2019-02-13 NOTE — Progress Notes (Addendum)
PCP - Michell Heinrich @ [facility] [place] Cardiologist - Cloretta Ned @ [facility] Electrophysiologist: Fatima Sanger @ [facility]  Chest x-ray - 7/19 EKG - 01/10/19 Stress Test -  ECHO - 7/19 Cardiac Cath - 7/19  Sleep Study - yrs. Ago--pt. Doesn't use cpap CPAP - na  Fasting Blood Sugar - 100 Checks Blood Sugar ___2__ times a day  Blood Thinner Instructions: Aspirin Instructions:  Anesthesia review: stop eliquis 3 days prior to surgery  Patient denies shortness of breath, fever, cough and chest pain at PAT appointment  Cardiac Device sheet faxed to [facility]. Email sent to [employer].   Patient verbalized understanding of instructions that were given to them at the PAT appointment. Patient was also instructed that they will need to review over the PAT instructions again at home before surgery.

## 2019-02-14 NOTE — Progress Notes (Signed)
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 Pertinent hx includes HFrEF (EF 25%), NICM, status post BiVICD 10/2017, DMII on insulin, HTN, HLD, Moderate MR and TR, AF , Hx of hypomagnesium, hx of increased PAC/PVC burden (Chronic Afib underneath V-Paced rhythm. Is on amiodarone due to high PVC burden not afib.),Tonsillar carcinoma status post Tonsillectomy/chemo 2014, GERD, Chronic Pain, OSA, psoriasis, Pulmonary nodule followed by pul[facility]ology at [facility], CKD.  Pt follows with cardiology at [facility], Dr. Edwin Dada. Last seen 02/11/19. Per note, pt stable at that time, euvolemic. Cardiac clearance 02/04/19 on pt chart.  Last Device interrogation July 2019. BiV paced 63% of the time.  Device form has been faxed.  Anticipate he can proceed as planned barring acute status change.   VS: BP 93/61   Pulse 97   Temp 36.4 C   Resp 20   Ht 5\' 10"  (1.778 m)   Wt 122.8 kg   SpO2 99%   BMI 38.84 kg/m   PROVIDERS: Michell Heinrich, DO is PCP  Cloretta Ned, MD is Cardiologist  Fuller Canada, MD is Pulmonologist  LABS: Elevated creatinine noted. Review of labs in care everywhere shows baseline over the past 6 months ~1.5-2.0. (all labs ordered are listed, but only abnormal results are displayed)  Labs Reviewed  GLUCOSE, CAPILLARY - Abnormal; Notable for the following components:      Result Value   Glucose-Capillary 135 (*)    All other components within normal limits  BASIC METABOLIC PANEL - Abnormal; Notable for the  following components:   Glucose, Bld 142 (*)    BUN 26 (*)    Creatinine, Ser 2.08 (*)    GFR calc non Af Amer 34 (*)    GFR calc Af Amer 40 (*)    All other components within normal limits  CBC - Abnormal; Notable for the following components:   RBC 3.83 (*)    Hemoglobin 11.8 (*)    HCT 37.2 (*)    RDW 15.9 (*)    All other components within normal limits     IMAGES: CT Chest 01/01/2019 (care everywhere): Impression:    Similar appearance of 1.3 cm nodule within the lingula which may represent a slow-growing neoplasm. Other scattered smaller pulmonary nodules are similar when compared to the prior examination.  EKG: 01/10/19 (care everywhere, copy on pt chart): Biventricular pacemaker rhythm. Rate 91.. Without magnet, Biventricular pacing, no pacing abnormalities . Predominance of paced beats precludes contour analysis.  CV: TTE 07/17/2018 (care everywhere): LV Ejection Fraction (%) 25 %  Right Ventricle Systolic Pressure (mmHg) 40 mmHg  LV End Diastolic Diameter (cm) 6.8 cm  LV End Systolic Diameter (cm) 5.9 cm  LV Septum Wall Thickness (cm) 1.0 cm  LV Posterior Wall Thickness (cm) 1.0 cm  Tricuspid Valve Regurgitation Grade moderate   Tricuspid Valve Regurgitation Max Velocity (m/s) 2.5 m/s  Mitral Valve Regurgitation Grade mild   Mitral Valve Stenosis Grade none   Aortic  Valve Regurgitation Grade none   Aortic Valve Stenosis Grade none   INTERPRETATION ---------------------------------------------------------------  SEVERE LV DYSFUNCTION (See above)  MILD RV SYSTOLIC DYSFUNCTION (See above)  VALVULAR REGURGITATION: MILD MR, MODERATE TR  NO VALVULAR STENOSIS  POOR SOUND TRANSMISSION DEFINITY IMAGING AGENT USED  MILD TO MODERATE MR  NO PRIOR STUDY FOR COMPARISON   Past Medical History:  Diagnosis Date  . AICD (automatic cardioverter/defibrillator) present   . Arthritis   . Cancer (HCC)    neck and throat HPV+1  . CHF (congestive heart failure) ([place])    . Chronic a-fib   . Diabetes mellitus without complication ([place])   . Dyspnea    on exertion  . Dysrhythmia    chronic afib  . GERD (gastroesophageal reflux disease)   . Hypothyroidism   . Pacemaker   . Presence of combination internal cardiac defibrillator (ICD) and pacemaker   . Sleep apnea     Past Surgical History:  Procedure Laterality Date  .  lymph nodes removed in neck Left 2015  . APPENDECTOMY    . PACEMAKER PLACEMENT     intrnal defobralator  . ROTATOR CUFF REPAIR    . SHOULDER SURGERY    . TONSILLECTOMY Left 2015  . WRIST SURGERY      MEDICATIONS: . amiodarone (PACERONE) 200 MG tablet  . apixaban (ELIQUIS) 5 MG TABS tablet  . atorvastatin (LIPITOR) 40 MG tablet  . chlorhexidine (PERIDEX) 0.12 % solution  . clobetasol ointment (TEMOVATE) 0.05 %  . fenofibrate (TRICOR) 145 MG tablet  . Guselkumab (TREMFYA) 100 MG/ML SOPN  . lansoprazole (PREVACID) 30 MG capsule  . levothyroxine (SYNTHROID, LEVOTHROID) 50 MCG tablet  . linagliptin (TRADJENTA) 5 MG TABS tablet  . losartan (COZAAR) 25 MG tablet  . Magnesium 400 MG CAPS  . meloxicam (MOBIC) 15 MG tablet  . metoprolol succinate (TOPROL-XL) 25 MG 24 hr tablet  . naloxone (NARCAN) nasal spray 4 mg/0.1 mL  . Oxycodone HCl 10 MG TABS  . sitaGLIPtin-metformin (JANUMET) 50-1000 MG tablet  . torsemide (DEMADEX) 20 MG tablet  . valACYclovir (VALTREX) 500 MG tablet  . venlafaxine XR (EFFEXOR-XR) 75 MG 24 hr capsule   . ropivacaine (PF) 2 mg/mL (0.2%) (NAROPIN) injection 1 mL    Wynonia Musty [facility] Short Stay Center/Anesthesiology Phone [phone] 02/15/2019 4:50 PM

## 2019-02-15 MED ORDER — DEXTROSE 5 % IV SOLN
3.0000 g | INTRAVENOUS | Status: AC
  Administered 2019-02-18: 3 g via INTRAVENOUS
  Filled 2019-02-15: qty 3

## 2019-02-17 NOTE — Anesthesia Preprocedure Evaluation (Addendum)
Anesthesia Evaluation  Patient identified by MRN, date of birth, ID band Patient awake    Reviewed: Allergy & Precautions, NPO status , Patient's Chart, lab work & pertinent test results  Airway Mallampati: IV  TM Distance: >3 FB Neck ROM: Full    Dental no notable dental hx. (+) Teeth Intact, Dental Advisory Given   Pulmonary sleep apnea and Continuous Positive Airway Pressure Ventilation , former smoker,    Pulmonary exam normal breath sounds clear to auscultation       Cardiovascular hypertension, Pt. on home beta blockers and Pt. on medications +CHF  Normal cardiovascular exam+ dysrhythmias Atrial Fibrillation + pacemaker + Cardiac Defibrillator  Rhythm:Regular Rate:Normal  TTE 07/17/2018 (care everywhere): LV Ejection Fraction (%) 25      Neuro/Psych PSYCHIATRIC DISORDERS Anxiety    GI/Hepatic Neg liver ROS, GERD  ,  Endo/Other  diabetes, Type 1Hypothyroidism   Renal/GU      Musculoskeletal   Abdominal (+) + obese,   Peds  Hematology negative hematology ROS (+)   Anesthesia Other Findings Tonsillar CA hx   Reproductive/Obstetrics                           Anesthesia Physical Anesthesia Plan  ASA: IV  Anesthesia Plan: Regional and MAC   Post-op Pain Management:  Regional for Post-op pain   Induction:   PONV Risk Score and Plan: Treatment may vary due to age or medical condition and Midazolam  Airway Management Planned: Natural Airway and Nasal Cannula  Additional Equipment:   Intra-op Plan:   Post-operative Plan:   Informed Consent: I have reviewed the patients History and Physical, chart, labs and discussed the procedure including the risks, benefits and alternatives for the proposed anesthesia with the patient or authorized representative who has indicated his/her understanding and acceptance.     Dental advisory given  Plan Discussed with: CRNA  Anesthesia Plan  Comments: (L trapezium excision w L supraclvicular block)       Anesthesia Quick Evaluation

## 2019-02-18 ENCOUNTER — Ambulatory Visit (HOSPITAL_COMMUNITY): Payer: BLUE CROSS/BLUE SHIELD | Admitting: Anesthesiology

## 2019-02-18 ENCOUNTER — Ambulatory Visit (HOSPITAL_COMMUNITY)
Admission: RE | Admit: 2019-02-18 | Discharge: 2019-02-18 | Disposition: A | Discharge status: Home / Self Care | Payer: BLUE CROSS/BLUE SHIELD | Attending: Orthopedic Surgery | Admitting: Orthopedic Surgery

## 2019-02-18 ENCOUNTER — Other Ambulatory Visit: Payer: Self-pay

## 2019-02-18 ENCOUNTER — Ambulatory Visit (HOSPITAL_COMMUNITY): Payer: BLUE CROSS/BLUE SHIELD | Admitting: Vascular Surgery

## 2019-02-18 ENCOUNTER — Encounter (HOSPITAL_COMMUNITY)
Admission: RE | Disposition: A | Discharge status: Home / Self Care | Payer: Self-pay | Source: Home / Self Care | Attending: Orthopedic Surgery

## 2019-02-18 ENCOUNTER — Encounter (HOSPITAL_COMMUNITY): Payer: Self-pay

## 2019-02-18 DIAGNOSIS — Z7984 Long term (current) use of oral hypoglycemic drugs: Secondary | ICD-10-CM | POA: Insufficient documentation

## 2019-02-18 DIAGNOSIS — Z79899 Other long term (current) drug therapy: Secondary | ICD-10-CM | POA: Diagnosis not present

## 2019-02-18 DIAGNOSIS — Z79890 Hormone replacement therapy (postmenopausal): Secondary | ICD-10-CM | POA: Insufficient documentation

## 2019-02-18 DIAGNOSIS — I509 Heart failure, unspecified: Secondary | ICD-10-CM | POA: Diagnosis not present

## 2019-02-18 DIAGNOSIS — Z87891 Personal history of nicotine dependence: Secondary | ICD-10-CM | POA: Insufficient documentation

## 2019-02-18 DIAGNOSIS — I482 Chronic atrial fibrillation, unspecified: Secondary | ICD-10-CM | POA: Insufficient documentation

## 2019-02-18 DIAGNOSIS — E039 Hypothyroidism, unspecified: Secondary | ICD-10-CM | POA: Diagnosis not present

## 2019-02-18 DIAGNOSIS — G4730 Sleep apnea, unspecified: Secondary | ICD-10-CM | POA: Diagnosis not present

## 2019-02-18 DIAGNOSIS — K219 Gastro-esophageal reflux disease without esophagitis: Secondary | ICD-10-CM | POA: Insufficient documentation

## 2019-02-18 DIAGNOSIS — M654 Radial styloid tenosynovitis [de Quervain]: Secondary | ICD-10-CM | POA: Diagnosis present

## 2019-02-18 DIAGNOSIS — Z95810 Presence of automatic (implantable) cardiac defibrillator: Secondary | ICD-10-CM | POA: Insufficient documentation

## 2019-02-18 DIAGNOSIS — Z791 Long term (current) use of non-steroidal anti-inflammatories (NSAID): Secondary | ICD-10-CM | POA: Insufficient documentation

## 2019-02-18 DIAGNOSIS — E10618 Type 1 diabetes mellitus with other diabetic arthropathy: Secondary | ICD-10-CM | POA: Diagnosis not present

## 2019-02-18 DIAGNOSIS — Z85819 Personal history of malignant neoplasm of unspecified site of lip, oral cavity, and pharynx: Secondary | ICD-10-CM | POA: Diagnosis not present

## 2019-02-18 DIAGNOSIS — Z7901 Long term (current) use of anticoagulants: Secondary | ICD-10-CM | POA: Diagnosis not present

## 2019-02-18 HISTORY — PX: CARPOMETACARPEL SUSPENSION PLASTY: SHX5005

## 2019-02-18 HISTORY — PX: DORSAL COMPARTMENT RELEASE: SHX5039

## 2019-02-18 HISTORY — PX: CARPAL BONE EXCISION: SHX6468

## 2019-02-18 LAB — GLUCOSE, CAPILLARY
Glucose-Capillary: 112 mg/dL — ABNORMAL HIGH (ref 70–99)
Glucose-Capillary: 127 mg/dL — ABNORMAL HIGH (ref 70–99)

## 2019-02-18 LAB — PROTIME-INR
INR: 1.24
Prothrombin Time: 15.5 seconds — ABNORMAL HIGH (ref 11.4–15.2)

## 2019-02-18 SURGERY — EXCISION, CARPAL BONE
Anesthesia: Monitor Anesthesia Care | Laterality: Left

## 2019-02-18 MED ORDER — ROPIVACAINE HCL 5 MG/ML IJ SOLN
INTRAMUSCULAR | Status: DC | PRN
  Administered 2019-02-18: 30 mL via PERINEURAL

## 2019-02-18 MED ORDER — MIDAZOLAM HCL 2 MG/2ML IJ SOLN
INTRAMUSCULAR | Status: AC
  Filled 2019-02-18: qty 2

## 2019-02-18 MED ORDER — PROPOFOL 1000 MG/100ML IV EMUL
INTRAVENOUS | Status: AC
  Filled 2019-02-18: qty 100

## 2019-02-18 MED ORDER — ACETAMINOPHEN 10 MG/ML IV SOLN: 1000.0000 mg | Freq: Once | INTRAVENOUS | Status: DC | PRN

## 2019-02-18 MED ORDER — METOPROLOL SUCCINATE ER 25 MG PO TB24
25.0000 mg | ORAL_TABLET | Freq: Every day | ORAL | Status: DC
  Administered 2019-02-18: 25 mg via ORAL
  Filled 2019-02-18: qty 1

## 2019-02-18 MED ORDER — LACTATED RINGERS IV SOLN
INTRAVENOUS | Status: DC | PRN
  Administered 2019-02-18: 07:00:00 via INTRAVENOUS

## 2019-02-18 MED ORDER — ACETAMINOPHEN 160 MG/5ML PO SOLN: 325.0000 mg | ORAL | Status: DC | PRN

## 2019-02-18 MED ORDER — MIDAZOLAM HCL 2 MG/2ML IJ SOLN
INTRAMUSCULAR | Status: DC | PRN
  Administered 2019-02-18: 1 mg via INTRAVENOUS

## 2019-02-18 MED ORDER — FENTANYL CITRATE (PF) 250 MCG/5ML IJ SOLN
INTRAMUSCULAR | Status: AC
  Filled 2019-02-18: qty 5

## 2019-02-18 MED ORDER — ONDANSETRON HCL 4 MG/2ML IJ SOLN
INTRAMUSCULAR | Status: DC | PRN
  Administered 2019-02-18: 4 mg via INTRAVENOUS

## 2019-02-18 MED ORDER — BUPIVACAINE HCL (PF) 0.25 % IJ SOLN
INTRAMUSCULAR | Status: AC
  Filled 2019-02-18: qty 30

## 2019-02-18 MED ORDER — DEXAMETHASONE SODIUM PHOSPHATE 10 MG/ML IJ SOLN
INTRAMUSCULAR | Status: AC
  Filled 2019-02-18: qty 1

## 2019-02-18 MED ORDER — FENTANYL CITRATE (PF) 250 MCG/5ML IJ SOLN
INTRAMUSCULAR | Status: DC | PRN
  Administered 2019-02-18: 50 ug via INTRAVENOUS

## 2019-02-18 MED ORDER — DEXAMETHASONE SODIUM PHOSPHATE 10 MG/ML IJ SOLN
INTRAMUSCULAR | Status: DC | PRN
  Administered 2019-02-18: 5 mg via INTRAVENOUS

## 2019-02-18 MED ORDER — ONDANSETRON HCL 4 MG/2ML IJ SOLN
INTRAMUSCULAR | Status: AC
  Filled 2019-02-18: qty 2

## 2019-02-18 MED ORDER — METOPROLOL SUCCINATE ER 25 MG PO TB24
ORAL_TABLET | ORAL | Status: AC
  Filled 2019-02-18: qty 1

## 2019-02-18 MED ORDER — PROPOFOL 10 MG/ML IV BOLUS
INTRAVENOUS | Status: DC | PRN
  Administered 2019-02-18: 30 mg via INTRAVENOUS

## 2019-02-18 MED ORDER — FENTANYL CITRATE (PF) 100 MCG/2ML IJ SOLN: 25.0000 ug | INTRAMUSCULAR | Status: DC | PRN

## 2019-02-18 MED ORDER — CHLORHEXIDINE GLUCONATE 4 % EX LIQD: 60.0000 mL | Freq: Once | CUTANEOUS | Status: DC

## 2019-02-18 MED ORDER — CLONIDINE HCL (ANALGESIA) 100 MCG/ML EP SOLN
EPIDURAL | Status: DC | PRN
  Administered 2019-02-18: 100 ug

## 2019-02-18 MED ORDER — 0.9 % SODIUM CHLORIDE (POUR BTL) OPTIME
TOPICAL | Status: DC | PRN
  Administered 2019-02-18: 1000 mL

## 2019-02-18 MED ORDER — PROPOFOL 500 MG/50ML IV EMUL
INTRAVENOUS | Status: DC | PRN
  Administered 2019-02-18: 100 ug/kg/min via INTRAVENOUS

## 2019-02-18 MED ORDER — ACETAMINOPHEN 325 MG PO TABS: 325.0000 mg | ORAL_TABLET | ORAL | Status: DC | PRN

## 2019-02-18 SURGICAL SUPPLY — 65 items
ANCH SUT SWLK MED 13.5X3.5 (Anchor) ×2 IMPLANT
APL SKNCLS STERI-STRIP NONHPOA (GAUZE/BANDAGES/DRESSINGS) ×1
BANDAGE ACE 3X5.8 VEL STRL LF (GAUZE/BANDAGES/DRESSINGS) IMPLANT
BANDAGE ACE 4X5 VEL STRL LF (GAUZE/BANDAGES/DRESSINGS) ×2 IMPLANT
BENZOIN TINCTURE PRP APPL 2/3 (GAUZE/BANDAGES/DRESSINGS) ×2 IMPLANT
BNDG CMPR 9X4 STRL LF SNTH (GAUZE/BANDAGES/DRESSINGS) ×1
BNDG COHESIVE 1X5 TAN STRL LF (GAUZE/BANDAGES/DRESSINGS) IMPLANT
BNDG ESMARK 4X9 LF (GAUZE/BANDAGES/DRESSINGS) ×3 IMPLANT
BNDG GAUZE ELAST 4 BULKY (GAUZE/BANDAGES/DRESSINGS) ×2 IMPLANT
CLOSURE STERI-STRIP 1/2X4 (GAUZE/BANDAGES/DRESSINGS) ×1
CLOSURE WOUND 1/2 X4 (GAUZE/BANDAGES/DRESSINGS)
CLSR STERI-STRIP ANTIMIC 1/2X4 (GAUZE/BANDAGES/DRESSINGS) ×1 IMPLANT
CORDS BIPOLAR (ELECTRODE) ×3 IMPLANT
COVER SURGICAL LIGHT HANDLE (MISCELLANEOUS) ×3 IMPLANT
COVER WAND RF STERILE (DRAPES) ×3 IMPLANT
CUFF TOURNIQUET SINGLE 18IN (TOURNIQUET CUFF) IMPLANT
CUFF TOURNIQUET SINGLE 24IN (TOURNIQUET CUFF) IMPLANT
DECANTER SPIKE VIAL GLASS SM (MISCELLANEOUS) IMPLANT
DRAPE OEC MINIVIEW 54X84 (DRAPES) IMPLANT
DRAPE SURG 17X23 STRL (DRAPES) ×3 IMPLANT
DURAPREP 26ML APPLICATOR (WOUND CARE) ×3 IMPLANT
GAUZE SPONGE 2X2 8PLY STRL LF (GAUZE/BANDAGES/DRESSINGS) IMPLANT
GAUZE SPONGE 4X4 12PLY STRL (GAUZE/BANDAGES/DRESSINGS) IMPLANT
GAUZE XEROFORM 1X8 LF (GAUZE/BANDAGES/DRESSINGS) IMPLANT
GLOVE SURG SYN 8.0 (GLOVE) ×6 IMPLANT
GLOVE SURG SYN 8.0 PF PI (GLOVE) ×2 IMPLANT
GOWN STRL REUS W/ TWL LRG LVL3 (GOWN DISPOSABLE) ×1 IMPLANT
GOWN STRL REUS W/ TWL XL LVL3 (GOWN DISPOSABLE) ×1 IMPLANT
GOWN STRL REUS W/TWL LRG LVL3 (GOWN DISPOSABLE) ×3
GOWN STRL REUS W/TWL XL LVL3 (GOWN DISPOSABLE) ×3
IMPL SWIVELOCK 3.5X13.5 (Anchor) IMPLANT
IMPLANT SWIVELOCK 3.5X13.5 (Anchor) ×6 IMPLANT
KIT ASCP FXDISP 3X8XBTNDS (KITS) IMPLANT
KIT BASIN OR (CUSTOM PROCEDURE TRAY) ×3 IMPLANT
KIT BIO-TENODESIS 3X8 DISP (KITS) ×3
KIT TURNOVER KIT B (KITS) ×3 IMPLANT
MANIFOLD NEPTUNE II (INSTRUMENTS) ×3 IMPLANT
NDL HYPO 25GX1X1/2 BEV (NEEDLE) IMPLANT
NEEDLE HYPO 25GX1X1/2 BEV (NEEDLE) IMPLANT
NS IRRIG 1000ML POUR BTL (IV SOLUTION) ×3 IMPLANT
PACK ORTHO EXTREMITY (CUSTOM PROCEDURE TRAY) ×3 IMPLANT
PAD ARMBOARD 7.5X6 YLW CONV (MISCELLANEOUS) ×6 IMPLANT
PAD CAST 3X4 CTTN HI CHSV (CAST SUPPLIES) IMPLANT
PADDING CAST COTTON 3X4 STRL (CAST SUPPLIES) ×3
PASSER SUT SWANSON 36MM LOOP (INSTRUMENTS) ×2 IMPLANT
SPECIMEN JAR SMALL (MISCELLANEOUS) ×3 IMPLANT
SPONGE GAUZE 2X2 STER 10/PKG (GAUZE/BANDAGES/DRESSINGS)
STRIP CLOSURE SKIN 1/2X4 (GAUZE/BANDAGES/DRESSINGS) IMPLANT
SUCTION FRAZIER HANDLE 10FR (MISCELLANEOUS)
SUCTION TUBE FRAZIER 10FR DISP (MISCELLANEOUS) IMPLANT
SUT ETHIBOND 3-0 V-5 (SUTURE) IMPLANT
SUT ETHILON 5 0 PS 2 18 (SUTURE) IMPLANT
SUT MERSILENE 4 0 P 3 (SUTURE) IMPLANT
SUT PROLENE 3 0 PS 2 (SUTURE) IMPLANT
SUT SILK 4 0 PS 2 (SUTURE) IMPLANT
SUT VIC AB 3-0 FS2 27 (SUTURE) IMPLANT
SUT VIC AB 4-0 P-3 18X BRD (SUTURE) IMPLANT
SUT VIC AB 4-0 P3 18 (SUTURE)
SYR CONTROL 10ML LL (SYRINGE) IMPLANT
TOWEL OR 17X24 6PK STRL BLUE (TOWEL DISPOSABLE) ×3 IMPLANT
TOWEL OR 17X26 10 PK STRL BLUE (TOWEL DISPOSABLE) ×3 IMPLANT
TUBE CONNECTING 12'X1/4 (SUCTIONS)
TUBE CONNECTING 12X1/4 (SUCTIONS) IMPLANT
UNDERPAD 30X30 (UNDERPADS AND DIAPERS) ×3 IMPLANT
WATER STERILE IRR 1000ML POUR (IV SOLUTION) ×3 IMPLANT

## 2019-02-18 NOTE — H&P (Signed)
Adam Andrade is an 58 y.o. male.   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  Past Medical History:  Diagnosis Date  . AICD (automatic cardioverter/defibrillator) present   . Arthritis   . Cancer (HCC)    neck and throat HPV+1  . CHF (congestive heart failure) ([place])   . Chronic a-fib   . Diabetes mellitus without complication ([place])   . Dyspnea    on exertion  . Dysrhythmia    chronic afib  . GERD (gastroesophageal reflux disease)   . Hypothyroidism   . Pacemaker   . Presence of combination internal cardiac defibrillator (ICD) and pacemaker   . Sleep apnea     Past Surgical History:  Procedure Laterality Date  .  lymph nodes removed in neck Left 2015  . APPENDECTOMY    . PACEMAKER PLACEMENT     intrnal defobralator  . ROTATOR CUFF REPAIR    . SHOULDER SURGERY    . TONSILLECTOMY Left 2015  . WRIST SURGERY      History reviewed. No pertinent family history. Social History:  reports that he quit smoking about 13 years ago. He has never used smokeless tobacco. He reports current alcohol use of about 6.0 standard drinks of alcohol per week. He reports that he does not use drugs.  Allergies: No Known Allergies  Facility-Administered Medications Prior to Admission  Medication Dose Route Frequency Provider Last Rate Last Dose  . ropivacaine (PF) 2 mg/mL (0.2%) (NAROPIN) injection 1 mL  1 mL Epidural Once Molli Barrows, MD       Medications Prior to Admission  Medication Sig Dispense Refill  . amiodarone (PACERONE) 200 MG tablet Take 200 mg by mouth daily.     Marland Kitchen atorvastatin (LIPITOR) 40 MG tablet Take 40 mg by mouth at bedtime.    . fenofibrate (TRICOR) 145 MG tablet Take 145 mg by mouth at bedtime.     Marland Kitchen levothyroxine (SYNTHROID, LEVOTHROID) 50 MCG tablet  Take 50 mcg by mouth daily before breakfast.     . losartan (COZAAR) 25 MG tablet Take 25 mg by mouth daily.    . Magnesium 400 MG CAPS Take 400 mg by mouth 2 (two) times daily.    . meloxicam (MOBIC) 15 MG tablet Take 15 mg by mouth daily.     . metoprolol succinate (TOPROL-XL) 25 MG 24 hr tablet Take 50 mg by mouth daily. Take with or immediately following a meal.     . Oxycodone HCl 10 MG TABS Take 1 tablet (10 mg total) by mouth 5 (five) times daily as needed for up to 30 days. 150 tablet 0  . sitaGLIPtin-metformin (JANUMET) 50-1000 MG tablet Take 1 tablet by mouth 2 (two) times daily with a meal.     . torsemide (DEMADEX) 20 MG tablet Take 60-80 mg by mouth See admin instructions. Alternate between 60mg  and 80mg  every other day.    . venlafaxine XR (EFFEXOR-XR) 75 MG 24 hr capsule Take 75 mg by mouth daily.   1  . apixaban (ELIQUIS) 5 MG TABS tablet Take 5 mg by mouth 2 (two) times daily.     . chlorhexidine (PERIDEX) 0.12 % solution 15 mLs by Mouth Rinse route 2 (two) times daily.    . clobetasol ointment (TEMOVATE) 9.60 % Apply 1 application topically daily as needed (  psoriasis).     . Guselkumab (TREMFYA) 100 MG/ML SOPN Inject 100 mg into the skin every 8 (eight) weeks.     . lansoprazole (PREVACID) 30 MG capsule Take 30 mg by mouth every morning.     . linagliptin (TRADJENTA) 5 MG TABS tablet Take 5 mg by mouth daily.     . naloxone (NARCAN) nasal spray 4 mg/0.1 mL Place 1 spray into the nose as needed (opioid overdose).     . valACYclovir (VALTREX) 500 MG tablet Take 2,000 mg by mouth See admin instructions. Take 4 tablets (2,000mg ) by mouth as directed at onset of cold sore.      Results for orders placed or performed during the hospital encounter of 02/18/19 (from the past 48 hour(s))  Glucose, capillary     Status: Abnormal   Collection Time: 02/18/19  6:07 AM  Result Value Ref Range   Glucose-Capillary 112 (H) 70 - 99 mg/dL  Protime-INR     Status: Abnormal   Collection Time:  02/18/19  6:31 AM  Result Value Ref Range   Prothrombin Time 15.5 (H) 11.4 - 15.2 seconds   INR 1.24     Comment: Performed at [facility] [address], [place], [place] [address]   No results found.  Review of Systems  All other systems reviewed and are negative.   Blood pressure 98/60, pulse 77, temperature 97.8 F (36.6 C), temperature source Oral, resp. rate 18, height 5\' 10"  (1.778 m), weight 117.9 kg, SpO2 95 %. Physical Exam  Constitutional: He is oriented to person, place, and time. He appears well-developed and well-nourished.  HENT:  Head: Normocephalic and atraumatic.  Neck: Normal range of motion.  Cardiovascular: Normal rate.  Respiratory: Effort normal.  Musculoskeletal:     Left hand: He exhibits tenderness, bony tenderness, deformity and swelling.     Comments: Left thumb pain, swelling, and obvious shouldering of CMC joint as well as first dorsal compartment pain and swelling  Neurological: He is alert and oriented to person, place, and time.  Skin: Skin is warm.  Psychiatric: He has a normal mood and affect. His behavior is normal. Judgment and thought content normal.     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  Patient is failed conservative measures and is consented for left thumb trapezium excision with APL tendon transfer and first dorsal compartment release.  Patient understands risks and benefits and the rehab process and wishes to proceed.  Schuyler Amor, MD 02/18/2019, 7:08 AM

## 2019-02-18 NOTE — Anesthesia Procedure Notes (Addendum)
Anesthesia Regional Block: Supraclavicular block   Pre-Anesthetic Checklist: ,, timeout performed, Correct Patient, Correct Site, Correct Laterality, Correct Procedure, Correct Position, site marked, Risks and benefits discussed,  Surgical consent,  Pre-op evaluation,  At surgeon's request and post-op pain management  Laterality: Left  Prep: chloraprep       Needles:  Injection technique: Single-shot  Needle Type: Echogenic Needle     Needle Length: 5cm  Needle Gauge: 21     Additional Needles:   Procedures:,,,, ultrasound used (permanent image in chart),,,,  Narrative:  Start time: 02/18/2019 6:59 AM End time: 02/18/2019 7:09 AM Injection made incrementally with aspirations every 5 mL.  Performed by: Personally  Anesthesiologist: Barnet Glasgow, MD

## 2019-02-18 NOTE — Progress Notes (Signed)
Orthopedic Tech Progress Note Patient Details:  DORSE LOCY 1961/07/06 [id number] PACU RN called requesting an arm sling for this patient to put on after getting dressed. Ortho Devices Type of Ortho Device: Arm sling Ortho Device/Splint Interventions: Other (comment)   Post Interventions Patient Tolerated: Other (comment) Instructions Provided: Other (comment)   Janit Pagan 02/18/2019, 9:39 AM

## 2019-02-18 NOTE — Transfer of Care (Signed)
Immediate Anesthesia Transfer of Care Note  Patient: KASHAUN BEBO  Procedure(s) Performed: LEFT HAND TRAPEZIUM EXCISION (Left ) LEFT HAND CARPOMETACARPEL ([place]) SUSPENSION PLASTY WITH APL TENDON GRAFT (Left ) RELEASE LEFT DORSAL COMPARTMENT (Left )  Patient Location: PACU  Anesthesia Type:MAC combined with regional for post-op pain  Level of Consciousness: awake  Airway & Oxygen Therapy: Patient Spontanous Breathing  Post-op Assessment: Report given to RN and Post -op Vital signs reviewed and stable  Post vital signs: Reviewed and stable  Last Vitals:  Vitals Value Taken Time  BP    Temp    Pulse 81 02/18/2019  9:06 AM  Resp    SpO2 90 % 02/18/2019  9:06 AM  Vitals shown include unvalidated device data.  Last Pain:  Vitals:   02/18/19 0641  TempSrc:   PainSc: 0-No pain      Patients Stated Pain Goal: 8 (65/99/35 7017)  Complications: No apparent anesthesia complications

## 2019-02-18 NOTE — Anesthesia Postprocedure Evaluation (Signed)
Anesthesia Post Note  Patient: Adam Andrade  Procedure(s) Performed: LEFT HAND TRAPEZIUM EXCISION (Left ) LEFT HAND CARPOMETACARPEL ([place]) SUSPENSION PLASTY WITH APL TENDON GRAFT (Left ) RELEASE LEFT DORSAL COMPARTMENT (Left )     Patient location during evaluation: PACU Anesthesia Type: Regional Level of consciousness: awake and alert Pain management: pain level controlled Vital Signs Assessment: post-procedure vital signs reviewed and stable Respiratory status: spontaneous breathing, nonlabored ventilation, respiratory function stable and patient connected to nasal cannula oxygen Cardiovascular status: stable and blood pressure returned to baseline Postop Assessment: no apparent nausea or vomiting Anesthetic complications: no    Last Vitals:  Vitals:   02/18/19 0929 02/18/19 0953  BP: 118/65 108/75  Pulse: 81 85  Resp:    Temp:  (!) 36.1 C  SpO2: 93% 95%    Last Pain:  Vitals:   02/18/19 0924  TempSrc:   PainSc: Helena A Houser

## 2019-02-18 NOTE — Anesthesia Procedure Notes (Signed)
Procedure Name: MAC Date/Time: 02/18/2019 7:38 AM Performed by: Imagene Riches, CRNA Pre-anesthesia Checklist: Patient identified, Emergency Drugs available, Suction available and Patient being monitored Patient Re-evaluated:Patient Re-evaluated prior to induction Oxygen Delivery Method: Simple face mask Preoxygenation: Pre-oxygenation with 100% oxygen

## 2019-02-18 NOTE — Op Note (Signed)
NAME: Adam, Andrade [id number] ACCOUNT [id number] DATE OF BIRTH:1961-02-07 FACILITY: [facility] LOCATION: [facility] PHYSICIAN:Danaka Llera A. Burney Gauze, MD  OPERATIVE REPORT  DATE OF PROCEDURE:  02/18/2019  PREOPERATIVE DIAGNOSIS:  Chronic and progressive left wrist, first dorsal compartment tenosynovitis and left thumb CMC arthropathy.  POSTOPERATIVE DIAGNOSIS:  Chronic and progressive left wrist, first dorsal compartment tenosynovitis and left thumb carpometacarpal arthropathy.  PROCEDURE:  Left wrist, first dorsal compartment release with tenosynovectomy as well as left thumb CMC suspension plasty with trapezium excision and EPL tendon transfer.  SURGEON:  Charlotte Crumb, MD  ASSISTANT:  Dasnoit PA.  ANESTHESIA:  Supraclavicular block.  COMPLICATIONS:  No complications.  DRAINS:  No drains.  SPECIMENS:  None.  DESCRIPTION OF PROCEDURE:  The patient was taken to the operating suite after induction of adequate Supraclavicular block and IV sedation.  Left upper extremity was prepped and draped in the usual sterile fashion.  An Esmarch was used to exsanguinate the  limb and the tourniquet was inflated to 250 mmHg.  At this point in time, incision was made over the dorsal radial aspect of the thumb metacarpal and first dorsal compartment on the left side.  Skin was incised sharply and dissection was carried down to  the level of the first dorsal compartment.  The first dorsal compartment was identified and split at its dorsal most extent, freeing up the APL and EPB tendons.  The EPB tendon had a separate sheath that was incised.  Once this was done, we carefully  identified and retracted.  The snuffbox artery proximally and then performed a dorsal CMC arthrotomy.  We subperiosteally dissected out the thumb metacarpal base and trapezium and then removed the trapezium in piecemeal using a combination of curettes,  osteotomes and rongeurs.  A complete CMC synovectomy was  performed.  At this point in time, we harvested the radial most slip of the APL tendon group at the musculotendinous junction proximally and passed it into the distal wound.  Then, using the  Arthrex 3.5 mm Biotenodesis screw set created a transosseous canal in the thumb metacarpal base and the index metacarpal base using the appropriate instrumentation.  We thoroughly irrigated the wound of osseous debris.  We then sutured the APL tendon  transfer with 2-0 FiberWire and passed it from dorsal to volar out the thumb metacarpal base and fixed it with a 3.5 mm Biotenodesis screw.  We then passed the remaining tail of the APL from volar to dorsal at the index metacarpal base and fixed it with  a second 3.5 mm Biotenodesis screw.  The remaining tail of the APL was sutured to the extensor carpi radialis brevis tendon dorsally.  The wound was then thoroughly irrigated and loosely closed in layers of 4-0 Vicryl to close the capsule, 4-0 Vicryl  subcutaneously and a 3-0 Prolene subcuticular stitch on the skin.  Steri-Strips, 4 x 4 fluffs and a radial gutter splint was applied.    The patient tolerated these procedures well and went to recovery in stable fashion.  AN/NUANCE  D:02/18/2019 T:02/18/2019 JOB:[id number]

## 2019-02-18 NOTE — Op Note (Signed)
Please see dictated report [phone]

## 2019-02-19 ENCOUNTER — Encounter (HOSPITAL_COMMUNITY): Payer: Self-pay | Admitting: Orthopedic Surgery

## 2019-03-13 ENCOUNTER — Ambulatory Visit: Payer: BLUE CROSS/BLUE SHIELD | Attending: Nurse Practitioner | Admitting: Nurse Practitioner

## 2019-03-13 ENCOUNTER — Other Ambulatory Visit: Payer: Self-pay

## 2019-03-13 ENCOUNTER — Encounter: Payer: Self-pay | Admitting: Nurse Practitioner

## 2019-03-13 VITALS — BP 98/76 | HR 95 | Temp 97.8°F | Resp 16 | Ht 70.0 in | Wt 260.0 lb

## 2019-03-13 DIAGNOSIS — M7918 Myalgia, other site: Secondary | ICD-10-CM | POA: Insufficient documentation

## 2019-03-13 DIAGNOSIS — Z79891 Long term (current) use of opiate analgesic: Secondary | ICD-10-CM

## 2019-03-13 DIAGNOSIS — E039 Hypothyroidism, unspecified: Secondary | ICD-10-CM | POA: Insufficient documentation

## 2019-03-13 DIAGNOSIS — E669 Obesity, unspecified: Secondary | ICD-10-CM

## 2019-03-13 DIAGNOSIS — G894 Chronic pain syndrome: Secondary | ICD-10-CM

## 2019-03-13 DIAGNOSIS — N529 Male erectile dysfunction, unspecified: Secondary | ICD-10-CM | POA: Insufficient documentation

## 2019-03-13 DIAGNOSIS — M25512 Pain in left shoulder: Secondary | ICD-10-CM | POA: Insufficient documentation

## 2019-03-13 DIAGNOSIS — Z9109 Other allergy status, other than to drugs and biological substances: Secondary | ICD-10-CM | POA: Insufficient documentation

## 2019-03-13 DIAGNOSIS — N189 Chronic kidney disease, unspecified: Secondary | ICD-10-CM | POA: Insufficient documentation

## 2019-03-13 DIAGNOSIS — I4430 Unspecified atrioventricular block: Secondary | ICD-10-CM | POA: Insufficient documentation

## 2019-03-13 DIAGNOSIS — E1169 Type 2 diabetes mellitus with other specified complication: Secondary | ICD-10-CM | POA: Insufficient documentation

## 2019-03-13 DIAGNOSIS — Z22322 Carrier or suspected carrier of Methicillin resistant Staphylococcus aureus: Secondary | ICD-10-CM | POA: Insufficient documentation

## 2019-03-13 DIAGNOSIS — G8929 Other chronic pain: Secondary | ICD-10-CM | POA: Diagnosis present

## 2019-03-13 DIAGNOSIS — Z79899 Other long term (current) drug therapy: Secondary | ICD-10-CM | POA: Insufficient documentation

## 2019-03-13 MED ORDER — OXYCODONE HCL 10 MG PO TABS: 10.0000 mg | ORAL_TABLET | Freq: Every day | ORAL | 0 refills | Status: DC

## 2019-03-13 MED ORDER — OXYCODONE HCL 10 MG PO TABS: 10.0000 mg | ORAL_TABLET | Freq: Every day | ORAL | 0 refills | Status: AC | PRN
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
## 2019-03-13 NOTE — Patient Instructions (Signed)
____________________________________________________________________________________________  Medication Rules  Purpose: To inform patients, and their family members, of our rules and regulations.  Applies to: All patients receiving prescriptions (written or electronic).  Pharmacy of record: Pharmacy where electronic prescriptions will be sent. If written prescriptions are taken to a different pharmacy, please inform the nursing staff. The pharmacy listed in the electronic medical record should be the one where you would like electronic prescriptions to be sent.  Electronic prescriptions: In compliance with the [place] Strengthen Opioid Misuse Prevention (STOP) Act of 2017 (Session Law 2017-74/H243), effective December 26, 2018, all controlled substances must be electronically prescribed. Calling prescriptions to the pharmacy will cease to exist.  Prescription refills: Only during scheduled appointments. Applies to all prescriptions.  NOTE: The following applies primarily to controlled substances (Opioid* Pain Medications).   Patient's responsibilities: 1. Pain Pills: Bring all pain pills to every appointment (except for procedure appointments). 2. Pill Bottles: Bring pills in original pharmacy bottle. Always bring the newest bottle. Bring bottle, even if empty. 3. Medication refills: You are responsible for knowing and keeping track of what medications you take and those you need refilled. The day before your appointment: write a list of all prescriptions that need to be refilled. The day of the appointment: give the list to the admitting nurse. Prescriptions will be written only during appointments. No prescriptions will be written on procedure days. If you forget a medication: it will not be "Called in", "Faxed", or "electronically sent". You will need to get another appointment to get these prescribed. No early refills. Do not call asking to have your prescription filled  early. 4. Prescription Accuracy: You are responsible for carefully inspecting your prescriptions before leaving our office. Have the discharge nurse carefully go over each prescription with you, before taking them home. Make sure that your name is accurately spelled, that your address is correct. Check the name and dose of your medication to make sure it is accurate. Check the number of pills, and the written instructions to make sure they are clear and accurate. Make sure that you are given enough medication to last until your next medication refill appointment. 5. Taking Medication: Take medication as prescribed. When it comes to controlled substances, taking less pills or less frequently than prescribed is permitted and encouraged. Never take more pills than instructed. Never take medication more frequently than prescribed.  6. Inform other Doctors: Always inform, all of your healthcare providers, of all the medications you take. 7. Pain Medication from other Providers: You are not allowed to accept any additional pain medication from any other Doctor or Healthcare provider. There are two exceptions to this rule. (see below) In the event that you require additional pain medication, you are responsible for notifying us, as stated below. 8. Medication Agreement: You are responsible for carefully reading and following our Medication Agreement. This must be signed before receiving any prescriptions from our practice. Safely store a copy of your signed Agreement. Violations to the Agreement will result in no further prescriptions. (Additional copies of our Medication Agreement are available upon request.) 9. Laws, Rules, & Regulations: All patients are expected to follow all Federal and State Laws, Statutes, Rules, & Regulations. Ignorance of the Laws does not constitute a valid excuse. The use of any illegal substances is prohibited. 10. Adopted CDC guidelines & recommendations: Target dosing levels will be  at or below 60 MME/day. Use of benzodiazepines** is not recommended.  Exceptions: There are only two exceptions to the rule of not   receiving pain medications from other Healthcare Providers. 1. Exception #1 (Emergencies): In the event of an emergency (i.e.: accident requiring emergency care), you are allowed to receive additional pain medication. However, you are responsible for: As soon as you are able, call our office [phone], at any time of the day or night, and leave a message stating your name, the date and nature of the emergency, and the name and dose of the medication prescribed. In the event that your call is answered by a member of our staff, make sure to document and save the date, time, and the name of the person that took your information.  2. Exception #2 (Planned Surgery): In the event that you are scheduled by another doctor or dentist to have any type of surgery or procedure, you are allowed (for a period no longer than 30 days), to receive additional pain medication, for the acute post-op pain. However, in this case, you are responsible for picking up a copy of our "Post-op Pain Management for Surgeons" handout, and giving it to your surgeon or dentist. This document is available at our office, and does not require an appointment to obtain it. Simply go to our office during business hours (Monday-Thursday from 8:00 AM to 4:00 PM) (Friday 8:00 AM to 12:00 Noon) or if you have a scheduled appointment with us, prior to your surgery, and ask for it by name. In addition, you will need to provide us with your name, name of your surgeon, type of surgery, and date of procedure or surgery.  *Opioid medications include: morphine, codeine, oxycodone, oxymorphone, hydrocodone, hydromorphone, meperidine, tramadol, tapentadol, buprenorphine, fentanyl, methadone. **Benzodiazepine medications include: diazepam (Valium), alprazolam (Xanax), clonazepam (Klonopine), lorazepam (Ativan), clorazepate  (Tranxene), chlordiazepoxide (Librium), estazolam (Prosom), oxazepam (Serax), temazepam (Restoril), triazolam (Halcion) (Last updated: 02/22/2018) ____________________________________________________________________________________________    

## 2019-03-13 NOTE — Progress Notes (Signed)
Nursing Pain Medication Assessment:  Safety precautions to be maintained throughout the outpatient stay will include: orient to surroundings, keep bed in low position, maintain call bell within reach at all times, provide assistance with transfer out of bed and ambulation.  Medication Inspection Compliance: Pill count conducted under aseptic conditions, in front of the patient. Neither the pills nor the bottle was removed from the patient's sight at any time. Once count was completed pills were immediately returned to the patient in their original bottle.  Medication: Oxycodone IR Pill/Patch Count: 11 of 150 pills remain Pill/Patch Appearance: Markings consistent with prescribed medication Bottle Appearance: Standard pharmacy container. Clearly labeled. Filled Date: 02 / 19 / 2020 Last Medication intake:  Today

## 2019-03-15 ENCOUNTER — Telehealth: Payer: Self-pay | Admitting: Anesthesiology

## 2019-03-15 NOTE — Telephone Encounter (Signed)
Wants to speak with nurse soon as possible. This is only msg left. Please call

## 2019-03-15 NOTE — Telephone Encounter (Signed)
Spoke with patient and he states his regular [facility] is out of his [facility].  They will have it in stock on Wednesday.  Can he get [facility]?   [facility]  Donella Stade, I have changed this as his pharmacy, but please checklbefore you escribe, because you know how this pharmacy thing is.  Thank you.Marland KitchenMarland Kitchen

## 2019-03-18 ENCOUNTER — Other Ambulatory Visit: Payer: Self-pay | Admitting: Nurse Practitioner

## 2019-03-18 MED ORDER — OXYCODONE HCL 10 MG PO TABS: 10.0000 mg | ORAL_TABLET | Freq: Every day | ORAL | 0 refills | Status: AC

## 2019-03-18 NOTE — Telephone Encounter (Signed)
The pharmacy is listed below.  I just wanted you to double check before you hit send.

## 2019-03-18 NOTE — Telephone Encounter (Signed)
Which pharmacy?

## 2019-03-18 NOTE — Telephone Encounter (Signed)
Sent!

## 2019-05-15 ENCOUNTER — Encounter: Payer: Self-pay | Admitting: Anesthesiology

## 2019-05-15 ENCOUNTER — Ambulatory Visit: Payer: BLUE CROSS/BLUE SHIELD | Attending: Anesthesiology | Admitting: Anesthesiology

## 2019-05-15 ENCOUNTER — Other Ambulatory Visit: Payer: Self-pay

## 2019-05-15 DIAGNOSIS — M25512 Pain in left shoulder: Secondary | ICD-10-CM | POA: Diagnosis not present

## 2019-05-15 DIAGNOSIS — M79641 Pain in right hand: Secondary | ICD-10-CM

## 2019-05-15 DIAGNOSIS — M7918 Myalgia, other site: Secondary | ICD-10-CM | POA: Diagnosis not present

## 2019-05-15 DIAGNOSIS — M79642 Pain in left hand: Secondary | ICD-10-CM

## 2019-05-15 DIAGNOSIS — G894 Chronic pain syndrome: Secondary | ICD-10-CM

## 2019-05-15 DIAGNOSIS — M545 Low back pain: Secondary | ICD-10-CM

## 2019-05-15 DIAGNOSIS — F1190 Opioid use, unspecified, uncomplicated: Secondary | ICD-10-CM

## 2019-05-15 DIAGNOSIS — Z79891 Long term (current) use of opiate analgesic: Secondary | ICD-10-CM | POA: Diagnosis not present

## 2019-05-15 DIAGNOSIS — G518 Other disorders of facial nerve: Secondary | ICD-10-CM

## 2019-05-15 DIAGNOSIS — G8929 Other chronic pain: Secondary | ICD-10-CM

## 2019-05-15 MED ORDER — OXYCODONE HCL 10 MG PO TABS: 10.0000 mg | ORAL_TABLET | Freq: Every day | ORAL | 0 refills | Status: DC | PRN

## 2019-05-15 MED ORDER — OXYCODONE HCL 10 MG PO TABS: 10.0000 mg | ORAL_TABLET | Freq: Every day | ORAL | 0 refills | Status: AC | PRN

## 2019-05-15 NOTE — Progress Notes (Signed)
Virtual Visit via Telephone Note  I connected with Adam Andrade on 05/15/19 at 11:15 AM EDT by telephone and verified that I am speaking with the correct person using two identifiers.  Location: Patient: Home Provider: Pain control center   I discussed the limitations, risks, security and privacy concerns of performing an evaluation and management service by telephone and the availability of in person appointments. I also discussed with the patient that there may be a patient responsible charge related to this service. The patient expressed understanding and agreed to proceed.   History of Present Illness: I spoke with Adam Andrade by phone today.  Unfortunately he is not able to utilize video technology on his phone and he reports that he is doing well with his low back pain and diffuse body and shoulder pain.  The quality characteristic and distribution of his pain is been stable in nature with no significant changes noted.  He is taking his medications as prescribed these are working well for him.  No diverting or illicit use is noted and he continues to derive good functional lifestyle benefit from the medications.   Observations/Objective:  Current Outpatient Medications:  .  amiodarone (PACERONE) 200 MG tablet, Take 200 mg by mouth daily. , Disp: , Rfl:  .  apixaban (ELIQUIS) 5 MG TABS tablet, Take 5 mg by mouth 2 (two) times daily. , Disp: , Rfl:  .  atorvastatin (LIPITOR) 40 MG tablet, Take 40 mg by mouth at bedtime., Disp: , Rfl:  .  chlorhexidine (PERIDEX) 0.12 % solution, 15 mLs by Mouth Rinse route 2 (two) times daily., Disp: , Rfl:  .  clobetasol ointment (TEMOVATE) 6.56 %, Apply 1 application topically daily as needed (psoriasis). , Disp: , Rfl:  .  fenofibrate (TRICOR) 145 MG tablet, Take 145 mg by mouth at bedtime. , Disp: , Rfl:  .  Guselkumab (TREMFYA) 100 MG/ML SOPN, Inject 100 mg into the skin every 8 (eight) weeks. , Disp: , Rfl:  .  lansoprazole (PREVACID) 30 MG capsule, Take  30 mg by mouth every morning. , Disp: , Rfl:  .  levothyroxine (SYNTHROID, LEVOTHROID) 50 MCG tablet, Take 50 mcg by mouth daily before breakfast. , Disp: , Rfl:  .  linagliptin (TRADJENTA) 5 MG TABS tablet, Take 5 mg by mouth daily. , Disp: , Rfl:  .  losartan (COZAAR) 25 MG tablet, Take 25 mg by mouth daily., Disp: , Rfl:  .  Magnesium 400 MG CAPS, Take 400 mg by mouth 2 (two) times daily., Disp: , Rfl:  .  meloxicam (MOBIC) 15 MG tablet, Take 15 mg by mouth daily. , Disp: , Rfl:  .  metoprolol succinate (TOPROL-XL) 25 MG 24 hr tablet, Take 50 mg by mouth daily. Take with or immediately following a meal. , Disp: , Rfl:  .  naloxone (NARCAN) nasal spray 4 mg/0.1 mL, Place 1 spray into the nose as needed (opioid overdose). , Disp: , Rfl:  .  [START ON 05/18/2019] Oxycodone HCl 10 MG TABS, Take 1 tablet (10 mg total) by mouth 5 (five) times daily as needed for up to 30 days., Disp: 150 tablet, Rfl: 0 .  [START ON 06/17/2019] Oxycodone HCl 10 MG TABS, Take 1 tablet (10 mg total) by mouth 5 (five) times daily as needed for up to 30 days., Disp: 150 tablet, Rfl: 0 .  sitaGLIPtin-metformin (JANUMET) 50-1000 MG tablet, Take 1 tablet by mouth 2 (two) times daily with a meal. , Disp: , Rfl:  .  torsemide (DEMADEX) 20 MG tablet, Take 60-80 mg by mouth See admin instructions. Alternate between 60mg  and 80mg  every other day., Disp: , Rfl:  .  valACYclovir (VALTREX) 500 MG tablet, Take 2,000 mg by mouth See admin instructions. Take 4 tablets (2,000mg ) by mouth as directed at onset of cold sore., Disp: , Rfl:  .  venlafaxine XR (EFFEXOR-XR) 75 MG 24 hr capsule, Take 75 mg by mouth daily. , Disp: , Rfl: 1  Current Facility-Administered Medications:  .  ropivacaine (PF) 2 mg/mL (0.2%) (NAROPIN) injection 1 mL, 1 mL, Epidural, Once, [name], MD   Assessment and Plan: 1. Chronic musculoskeletal pain   2. Chronic pain in left shoulder   3. Chronic pain syndrome   4. Long term current use of opiate  analgesic   5. Neuralgic facial pain   6. Bilateral hand pain   7. Chronic, continuous use of opioids   8. Chronic bilateral low back pain without sciatica   Based on our discussion today and based on the [facility] practitioner database information we will refill his medications for dates May 23 and June 22 with a scheduled return to clinic in 2 months.  He has been instructed to continue follow-up with his primary care physicians and contact us should he have any problems with pain management.   Follow Up Instructions: 2 months  I discussed the assessment and treatment plan with the patient. The patient was provided an opportunity to ask questions and all were answered. The patient agreed with the plan and demonstrated an understanding of the instructions.   The patient was advised to call back or seek an in-person evaluation if the symptoms worsen or if the condition fails to improve as anticipated.  I provided 20 minutes of non-face-to-face time during this encounter.   Molli Barrows, MD

## 2019-07-03 ENCOUNTER — Encounter: Payer: Self-pay | Admitting: Anesthesiology

## 2019-07-03 ENCOUNTER — Other Ambulatory Visit: Payer: Self-pay

## 2019-07-03 ENCOUNTER — Ambulatory Visit: Payer: Medicare Other | Attending: Anesthesiology | Admitting: Anesthesiology

## 2019-07-03 DIAGNOSIS — G894 Chronic pain syndrome: Secondary | ICD-10-CM | POA: Diagnosis not present

## 2019-07-03 DIAGNOSIS — M79642 Pain in left hand: Secondary | ICD-10-CM

## 2019-07-03 DIAGNOSIS — M7918 Myalgia, other site: Secondary | ICD-10-CM

## 2019-07-03 DIAGNOSIS — G518 Other disorders of facial nerve: Secondary | ICD-10-CM

## 2019-07-03 DIAGNOSIS — M542 Cervicalgia: Secondary | ICD-10-CM

## 2019-07-03 DIAGNOSIS — Z79891 Long term (current) use of opiate analgesic: Secondary | ICD-10-CM | POA: Diagnosis not present

## 2019-07-03 DIAGNOSIS — M25512 Pain in left shoulder: Secondary | ICD-10-CM

## 2019-07-03 DIAGNOSIS — M79641 Pain in right hand: Secondary | ICD-10-CM

## 2019-07-03 DIAGNOSIS — G8929 Other chronic pain: Secondary | ICD-10-CM

## 2019-07-03 DIAGNOSIS — M545 Low back pain: Secondary | ICD-10-CM

## 2019-07-03 MED ORDER — OXYCODONE HCL 10 MG PO TABS: 10.0000 mg | ORAL_TABLET | Freq: Every day | ORAL | 0 refills | Status: DC | PRN

## 2019-07-03 MED ORDER — OXYCODONE HCL 10 MG PO TABS: 10.0000 mg | ORAL_TABLET | Freq: Every day | ORAL | 0 refills | Status: AC | PRN

## 2019-07-03 NOTE — Progress Notes (Signed)
Virtual Visit via Telephone Note  I connected with Adam Andrade on 07/03/19 at 10:15 AM EDT by telephone and verified that I am speaking with the correct person using two identifiers.  Location: Patient: Home Provider: Pain control center   I discussed the limitations, risks, security and privacy concerns of performing an evaluation and management service by telephone and the availability of in person appointments. I also discussed with the patient that there may be a patient responsible charge related to this service. The patient expressed understanding and agreed to proceed.   History of Present Illness: I spoke with Adam Andrade today regarding his care.  He was unable to do video voice conferencing but this was via telephone.  He states that he has been doing well with his medications and no significant changes are noted.  Otherwise is in her usual state of health at this time.  He continues have diffuse body pain and occasional jaw pain which is been stable in nature.  He takes medications as prescribed and these continue to enable him to have good functional lifestyle improvement with the medications and no side effects are reported.  The quality characteristic and distribution of his pain complex is stable in nature.    Observations/Objective:  Current Outpatient Medications:  .  amiodarone (PACERONE) 200 MG tablet, Take 200 mg by mouth daily. , Disp: , Rfl:  .  apixaban (ELIQUIS) 5 MG TABS tablet, Take 5 mg by mouth 2 (two) times daily. , Disp: , Rfl:  .  atorvastatin (LIPITOR) 40 MG tablet, Take 40 mg by mouth at bedtime., Disp: , Rfl:  .  chlorhexidine (PERIDEX) 0.12 % solution, 15 mLs by Mouth Rinse route 2 (two) times daily., Disp: , Rfl:  .  clobetasol ointment (TEMOVATE) 4.40 %, Apply 1 application topically daily as needed (psoriasis). , Disp: , Rfl:  .  fenofibrate (TRICOR) 145 MG tablet, Take 145 mg by mouth at bedtime. , Disp: , Rfl:  .  Guselkumab (TREMFYA) 100 MG/ML SOPN, Inject  100 mg into the skin every 8 (eight) weeks. , Disp: , Rfl:  .  lansoprazole (PREVACID) 30 MG capsule, Take 30 mg by mouth every morning. , Disp: , Rfl:  .  levothyroxine (SYNTHROID, LEVOTHROID) 50 MCG tablet, Take 50 mcg by mouth daily before breakfast. , Disp: , Rfl:  .  linagliptin (TRADJENTA) 5 MG TABS tablet, Take 5 mg by mouth daily. , Disp: , Rfl:  .  losartan (COZAAR) 25 MG tablet, Take 25 mg by mouth daily., Disp: , Rfl:  .  Magnesium 400 MG CAPS, Take 400 mg by mouth 2 (two) times daily., Disp: , Rfl:  .  meloxicam (MOBIC) 15 MG tablet, Take 15 mg by mouth daily. , Disp: , Rfl:  .  metoprolol succinate (TOPROL-XL) 25 MG 24 hr tablet, Take 50 mg by mouth daily. Take with or immediately following a meal. , Disp: , Rfl:  .  naloxone (NARCAN) nasal spray 4 mg/0.1 mL, Place 1 spray into the nose as needed (opioid overdose). , Disp: , Rfl:  .  [START ON 07/17/2019] Oxycodone HCl 10 MG TABS, Take 1 tablet (10 mg total) by mouth 5 (five) times daily as needed., Disp: 150 tablet, Rfl: 0 .  [START ON 08/16/2019] Oxycodone HCl 10 MG TABS, Take 1 tablet (10 mg total) by mouth 5 (five) times daily as needed., Disp: 150 tablet, Rfl: 0 .  sitaGLIPtin-metformin (JANUMET) 50-1000 MG tablet, Take 1 tablet by mouth 2 (two) times daily with  a meal. , Disp: , Rfl:  .  torsemide (DEMADEX) 20 MG tablet, Take 60-80 mg by mouth See admin instructions. Alternate between 60mg  and 80mg  every other day., Disp: , Rfl:  .  valACYclovir (VALTREX) 500 MG tablet, Take 2,000 mg by mouth See admin instructions. Take 4 tablets (2,000mg ) by mouth as directed at onset of cold sore., Disp: , Rfl:  .  venlafaxine XR (EFFEXOR-XR) 75 MG 24 hr capsule, Take 75 mg by mouth daily. , Disp: , Rfl: 1  Current Facility-Administered Medications:  .  ropivacaine (PF) 2 mg/mL (0.2%) (NAROPIN) injection 1 mL, 1 mL, Epidural, Once, Adam Elk Alvina Filbert, MD   Assessment and Plan: 1. Chronic musculoskeletal pain   2. Chronic pain in left shoulder    3. Chronic pain syndrome   4. Long term current use of opiate analgesic   5. Neuralgic facial pain   6. Bilateral hand pain   7. Chronic bilateral low back pain without sciatica   8. Cervicalgia   9. Pain in joint of left shoulder   Based on our discussion today and upon review of the [facility] practitioner database information I am going to refill his medications for the next 2 months.  This will be for July 22 and August 21.  We will schedule him for return to clinic in 2 months.  In the meantime he is to continue follow-up with his primary care physicians for his baseline medical care and instructed to contact us at the pain control center for other pain related problems   Follow Up Instructions:    I discussed the assessment and treatment plan with the patient. The patient was provided an opportunity to ask questions and all were answered. The patient agreed with the plan and demonstrated an understanding of the instructions.   The patient was advised to call back or seek an in-person evaluation if the symptoms worsen or if the condition fails to improve as anticipated.  I provided 30 minutes of non-face-to-face time during this encounter.   Molli Barrows, MD

## 2019-08-05 ENCOUNTER — Other Ambulatory Visit: Payer: Self-pay | Admitting: Internal Medicine

## 2019-08-05 DIAGNOSIS — Z20822 Contact with and (suspected) exposure to covid-19: Secondary | ICD-10-CM

## 2019-08-07 LAB — NOVEL CORONAVIRUS, NAA: SARS-CoV-2, NAA: NOT DETECTED

## 2019-08-29 ENCOUNTER — Ambulatory Visit: Payer: Medicare Other | Attending: Anesthesiology | Admitting: Anesthesiology

## 2019-08-29 ENCOUNTER — Encounter: Payer: Self-pay | Admitting: Anesthesiology

## 2019-08-29 ENCOUNTER — Other Ambulatory Visit: Payer: Self-pay

## 2019-08-29 DIAGNOSIS — M25512 Pain in left shoulder: Secondary | ICD-10-CM

## 2019-08-29 DIAGNOSIS — G894 Chronic pain syndrome: Secondary | ICD-10-CM | POA: Diagnosis not present

## 2019-08-29 DIAGNOSIS — Z79891 Long term (current) use of opiate analgesic: Secondary | ICD-10-CM | POA: Diagnosis not present

## 2019-08-29 DIAGNOSIS — M7918 Myalgia, other site: Secondary | ICD-10-CM

## 2019-08-29 DIAGNOSIS — M542 Cervicalgia: Secondary | ICD-10-CM

## 2019-08-29 DIAGNOSIS — G518 Other disorders of facial nerve: Secondary | ICD-10-CM

## 2019-08-29 DIAGNOSIS — G8929 Other chronic pain: Secondary | ICD-10-CM

## 2019-08-29 DIAGNOSIS — M79642 Pain in left hand: Secondary | ICD-10-CM

## 2019-08-29 DIAGNOSIS — M79641 Pain in right hand: Secondary | ICD-10-CM

## 2019-08-29 DIAGNOSIS — M545 Low back pain: Secondary | ICD-10-CM

## 2019-08-29 MED ORDER — DEXTROSE 50 % IV SOLN
12.50 | INTRAVENOUS | Status: DC
Start: ? — End: 2019-08-29

## 2019-08-29 MED ORDER — TUSSI PRES-B 2-15-200 MG/5ML PO LIQD
0.50 | ORAL | Status: DC
Start: ? — End: 2019-08-29

## 2019-08-29 MED ORDER — AMIODARONE HCL IV
200.00 | INTRAVENOUS | Status: DC
Start: 2019-08-29 — End: 2019-08-29

## 2019-08-29 MED ORDER — LOSARTAN POTASSIUM 25 MG PO TABS
12.50 | ORAL_TABLET | ORAL | Status: DC
Start: 2019-08-29 — End: 2019-08-29

## 2019-08-29 MED ORDER — OXYCODONE HCL 10 MG PO TABS: 10.0000 mg | ORAL_TABLET | Freq: Every day | ORAL | 0 refills | Status: DC | PRN

## 2019-08-29 MED ORDER — QUINERVA 260 MG PO TABS
650.00 | ORAL_TABLET | ORAL | Status: DC
Start: ? — End: 2019-08-29

## 2019-08-29 MED ORDER — INSULIN LISPRO 100 UNIT/ML ~~LOC~~ SOLN
0.00 | SUBCUTANEOUS | Status: DC
Start: 2019-08-29 — End: 2019-08-29

## 2019-08-29 MED ORDER — OXYCODONE HCL 10 MG PO TABS: 10.0000 mg | ORAL_TABLET | Freq: Every day | ORAL | 0 refills | Status: AC | PRN

## 2019-08-29 MED ORDER — SPIRONOLACTONE 25 MG PO TABS
12.50 | ORAL_TABLET | ORAL | Status: DC
Start: 2019-08-29 — End: 2019-08-29

## 2019-08-29 MED ORDER — ATORVASTATIN CALCIUM 40 MG PO TABS
40.00 | ORAL_TABLET | ORAL | Status: DC
Start: 2019-08-29 — End: 2019-08-29

## 2019-08-29 MED ORDER — BAYER WOMENS 81-300 MG PO TABS
40.00 | ORAL_TABLET | ORAL | Status: DC
Start: 2019-08-29 — End: 2019-08-29

## 2019-08-29 MED ORDER — GLUCAGON HCL RDNA (DIAGNOSTIC) 1 MG IJ SOLR
1.00 | INTRAMUSCULAR | Status: DC
Start: ? — End: 2019-08-29

## 2019-08-29 MED ORDER — MELATONIN 3 MG PO TABS
6.00 | ORAL_TABLET | ORAL | Status: DC
Start: ? — End: 2019-08-29

## 2019-08-29 MED ORDER — VENLAFAXINE HCL ER 75 MG PO CP24
75.00 | ORAL_CAPSULE | ORAL | Status: DC
Start: 2019-08-29 — End: 2019-08-29

## 2019-08-29 MED ORDER — GENERIC EXTERNAL MEDICATION
120.00 | Status: DC
Start: 2019-08-29 — End: 2019-08-29

## 2019-08-29 MED ORDER — LEVOTHYROXINE SODIUM 125 MCG PO TABS
125.00 | ORAL_TABLET | ORAL | Status: DC
Start: 2019-08-30 — End: 2019-08-29

## 2019-08-29 MED ORDER — METOPROLOL SUCCINATE ER 100 MG PO TB24
100.00 | ORAL_TABLET | ORAL | Status: DC
Start: 2019-08-29 — End: 2019-08-29

## 2019-08-29 MED ORDER — APIXABAN 5 MG PO TABS
5.00 | ORAL_TABLET | ORAL | Status: DC
Start: 2019-08-29 — End: 2019-08-29

## 2019-08-29 NOTE — Progress Notes (Signed)
Virtual Visit via Telephone Note  I connected with Adam Andrade on 08/29/19 at 12:00 PM EDT by telephone and verified that I am speaking with the correct person using two identifiers.  Location: Patient: Home Provider: Pain control center   I discussed the limitations, risks, security and privacy concerns of performing an evaluation and management service by telephone and the availability of in person appointments. I also discussed with the patient that there may be a patient responsible charge related to this service. The patient expressed understanding and agreed to proceed.   History of Present Illness: I spoke with regarding his diffuse pain.  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  He has been on a longstanding dose of oxycodone 5 times a day and this continues to work well for his diffuse body pain.  He denies any side effects with the medications and derives good functional lifestyle improvement with the medicines based on our conversation today.  No significant changes are reported since our last conversation.  This was via telephone.  Otherwise he is in his usual state of health.   Observations/Objective: Current Outpatient Medications:  .  amiodarone (PACERONE) 200 MG tablet, Take 200 mg by mouth daily. , Disp: , Rfl:  .  apixaban (ELIQUIS) 5 MG TABS tablet, Take 5 mg by mouth 2 (two) times daily. , Disp: , Rfl:  .  atorvastatin (LIPITOR) 40 MG tablet, Take 40 mg by mouth at bedtime., Disp: , Rfl:  .  chlorhexidine (PERIDEX) 0.12 % solution, 15 mLs by Mouth Rinse route 2 (two) times daily., Disp: , Rfl:  .  clobetasol ointment (TEMOVATE) 0.17 %, Apply 1 application topically daily as needed (psoriasis). , Disp: , Rfl:  .  fenofibrate (TRICOR) 145 MG tablet, Take 145 mg by mouth at bedtime. , Disp: , Rfl:  .  Guselkumab (TREMFYA) 100 MG/ML SOPN, Inject 100 mg into the skin every 8 (eight) weeks. ,  Disp: , Rfl:  .  lansoprazole (PREVACID) 30 MG capsule, Take 30 mg by mouth every morning. , Disp: , Rfl:  .  levothyroxine (SYNTHROID, LEVOTHROID) 50 MCG tablet, Take 50 mcg by mouth daily before breakfast. , Disp: , Rfl:  .  linagliptin (TRADJENTA) 5 MG TABS tablet, Take 5 mg by mouth daily. , Disp: , Rfl:  .  losartan (COZAAR) 25 MG tablet, Take 25 mg by mouth daily., Disp: , Rfl:  .  Magnesium 400 MG CAPS, Take 400 mg by mouth 2 (two) times daily., Disp: , Rfl:  .  meloxicam (MOBIC) 15 MG tablet, Take 15 mg by mouth daily. , Disp: , Rfl:  .  metoprolol succinate (TOPROL-XL) 25 MG 24 hr tablet, Take 50 mg by mouth daily. Take with or immediately following a meal. , Disp: , Rfl:  .  naloxone (NARCAN) nasal spray 4 mg/0.1 mL, Place 1 spray into the nose as needed (opioid overdose). , Disp: , Rfl:  .  [START ON 09/11/2019] Oxycodone HCl 10 MG TABS, Take 1 tablet (10 mg total) by mouth 5 (five) times daily as needed., Disp: 150 tablet, Rfl: 0 .  [START ON 10/11/2019] Oxycodone HCl 10 MG TABS, Take 1 tablet (10 mg total) by mouth 5 (five) times daily as needed., Disp: 150 tablet, Rfl: 0 .  sitaGLIPtin-metformin (JANUMET) 50-1000 MG tablet, Take 1 tablet by mouth 2 (two) times daily with a meal. , Disp: , Rfl:  .  torsemide (DEMADEX) 20  MG tablet, Take 60-80 mg by mouth See admin instructions. Alternate between 60mg  and 80mg  every other day., Disp: , Rfl:  .  valACYclovir (VALTREX) 500 MG tablet, Take 2,000 mg by mouth See admin instructions. Take 4 tablets (2,000mg ) by mouth as directed at onset of cold sore., Disp: , Rfl:  .  venlafaxine XR (EFFEXOR-XR) 75 MG 24 hr capsule, Take 75 mg by mouth daily. , Disp: , Rfl: 1  Current Facility-Administered Medications:  .  ropivacaine (PF) 2 mg/mL (0.2%) (NAROPIN) injection 1 mL, 1 mL, Epidural, Once, [name], MD   Assessment and Plan: 1. Chronic musculoskeletal pain   2. Chronic pain in left shoulder   3. Chronic pain syndrome   4. Long term  current use of opiate analgesic   5. Neuralgic facial pain   6. Bilateral hand pain   7. Chronic bilateral low back pain without sciatica   8. Cervicalgia   Based on our discussion today and upon review of the [facility] practitioner database information going to refill his medications with return to clinic in 2 months.  Medications will be refilled for September 16 and October 16 for 150 tablets upon review of the database.  He is to continue follow-up with his primary care physicians for his baseline medical care and instructed to contact us with pain control center should he have any problems in the meantime regarding pain management.   Follow Up Instructions:    I discussed the assessment and treatment plan with the patient. The patient was provided an opportunity to ask questions and all were answered. The patient agreed with the plan and demonstrated an understanding of the instructions.   The patient was advised to call back or seek an in-person evaluation if the symptoms worsen or if the condition fails to improve as anticipated.  I provided 30 minutes of non-face-to-face time during this encounter.   Molli Barrows, MD

## 2019-10-11 ENCOUNTER — Telehealth: Payer: Self-pay | Admitting: *Deleted

## 2019-10-11 ENCOUNTER — Telehealth: Payer: Self-pay | Admitting: Anesthesiology

## 2019-10-11 NOTE — Telephone Encounter (Signed)
Patient just tried to get script filled and pharmacy told him they dont have another script available. I looked in chart and it is showing script to fill today. Can you please call pharmacy and see what the problem is.

## 2019-10-11 NOTE — Telephone Encounter (Signed)
Patient called back he went to pharmacy in person and they got it figured out. He was able to pick up scripts

## 2019-10-11 NOTE — Telephone Encounter (Signed)
Called pharmacy and they do have Rx.  Called patient to let him know and he had already gone to drug store and straightened it out.

## 2019-10-31 ENCOUNTER — Other Ambulatory Visit: Payer: Self-pay

## 2019-10-31 ENCOUNTER — Encounter: Payer: Self-pay | Admitting: Anesthesiology

## 2019-10-31 ENCOUNTER — Ambulatory Visit: Payer: BC Managed Care – PPO | Attending: Anesthesiology | Admitting: Anesthesiology

## 2019-10-31 DIAGNOSIS — M79641 Pain in right hand: Secondary | ICD-10-CM

## 2019-10-31 DIAGNOSIS — M542 Cervicalgia: Secondary | ICD-10-CM

## 2019-10-31 DIAGNOSIS — G894 Chronic pain syndrome: Secondary | ICD-10-CM

## 2019-10-31 DIAGNOSIS — Z79891 Long term (current) use of opiate analgesic: Secondary | ICD-10-CM | POA: Diagnosis not present

## 2019-10-31 DIAGNOSIS — G518 Other disorders of facial nerve: Secondary | ICD-10-CM

## 2019-10-31 DIAGNOSIS — M7918 Myalgia, other site: Secondary | ICD-10-CM

## 2019-10-31 DIAGNOSIS — M25512 Pain in left shoulder: Secondary | ICD-10-CM

## 2019-10-31 DIAGNOSIS — M79642 Pain in left hand: Secondary | ICD-10-CM

## 2019-10-31 DIAGNOSIS — M545 Low back pain: Secondary | ICD-10-CM

## 2019-10-31 DIAGNOSIS — G8929 Other chronic pain: Secondary | ICD-10-CM

## 2019-10-31 MED ORDER — OXYCODONE HCL 10 MG PO TABS: 10.0000 mg | ORAL_TABLET | ORAL | 0 refills | Status: DC

## 2019-10-31 MED ORDER — OXYCODONE HCL 10 MG PO TABS: 10.0000 mg | ORAL_TABLET | Freq: Every day | ORAL | 0 refills | Status: DC | PRN

## 2019-11-01 NOTE — Progress Notes (Signed)
Virtual Visit via Video Note  I connected with Adam Andrade on 11/01/19 at  2:00 PM EST by a video enabled telemedicine application and verified that I am speaking with the correct person using two identifiers.  Location: Patient: Home Provider: Pain control center   I discussed the limitations of evaluation and management by telemedicine and the availability of in person appointments. The patient expressed understanding and agreed to proceed.  History of Present Illness: I spoke to [name] via virtual video conferencing.  He seems to be doing well with his current pain complaints and these have been stable in nature.  He continues to have some intermittent facial pain of the same nature with persistent shoulder and hand pain.  Unfortunately he has failed conservative therapy management and continues to require opioid management for pain control however this is worked well for him.  Based on our discussion today he continues to derive good functional lifestyle provement with the medications and no untoward side effects.  The quality characteristic and distribution of pain is been stable as well.   Observations/Objective:  Current Outpatient Medications:  .  amiodarone (PACERONE) 200 MG tablet, Take 200 mg by mouth daily. , Disp: , Rfl:  .  apixaban (ELIQUIS) 5 MG TABS tablet, Take 5 mg by mouth 2 (two) times daily. , Disp: , Rfl:  .  atorvastatin (LIPITOR) 40 MG tablet, Take 40 mg by mouth at bedtime., Disp: , Rfl:  .  chlorhexidine (PERIDEX) 0.12 % solution, 15 mLs by Mouth Rinse route 2 (two) times daily., Disp: , Rfl:  .  clobetasol ointment (TEMOVATE) 3.15 %, Apply 1 application topically daily as needed (psoriasis). , Disp: , Rfl:  .  fenofibrate (TRICOR) 145 MG tablet, Take 145 mg by mouth at bedtime. , Disp: , Rfl:  .  Guselkumab (TREMFYA) 100 MG/ML SOPN, Inject 100 mg into the skin every 8 (eight) weeks. , Disp: , Rfl:  .  lansoprazole (PREVACID) 30 MG capsule, Take 30 mg by mouth  every morning. , Disp: , Rfl:  .  levothyroxine (SYNTHROID, LEVOTHROID) 50 MCG tablet, Take 50 mcg by mouth daily before breakfast. , Disp: , Rfl:  .  linagliptin (TRADJENTA) 5 MG TABS tablet, Take 5 mg by mouth daily. , Disp: , Rfl:  .  losartan (COZAAR) 25 MG tablet, Take 25 mg by mouth daily., Disp: , Rfl:  .  Magnesium 400 MG CAPS, Take 400 mg by mouth 2 (two) times daily., Disp: , Rfl:  .  meloxicam (MOBIC) 15 MG tablet, Take 15 mg by mouth daily. , Disp: , Rfl:  .  metoprolol succinate (TOPROL-XL) 25 MG 24 hr tablet, Take 50 mg by mouth daily. Take with or immediately following a meal. , Disp: , Rfl:  .  naloxone (NARCAN) nasal spray 4 mg/0.1 mL, Place 1 spray into the nose as needed (opioid overdose). , Disp: , Rfl:  .  [START ON 11/10/2019] Oxycodone HCl 10 MG TABS, Take 1 tablet (10 mg total) by mouth 5 (five) times daily as needed., Disp: 150 tablet, Rfl: 0 .  [START ON 12/10/2019] Oxycodone HCl 10 MG TABS, Take 1 tablet (10 mg total) by mouth every 4 (four) hours., Disp: 150 tablet, Rfl: 0 .  sitaGLIPtin-metformin (JANUMET) 50-1000 MG tablet, Take 1 tablet by mouth 2 (two) times daily with a meal. , Disp: , Rfl:  .  torsemide (DEMADEX) 20 MG tablet, Take 60-80 mg by mouth See admin instructions. Alternate between 60mg  and 80mg  every other  day., Disp: , Rfl:  .  valACYclovir (VALTREX) 500 MG tablet, Take 2,000 mg by mouth See admin instructions. Take 4 tablets (2,000mg ) by mouth as directed at onset of cold sore., Disp: , Rfl:  .  venlafaxine XR (EFFEXOR-XR) 75 MG 24 hr capsule, Take 75 mg by mouth daily. , Disp: , Rfl: 1  Current Facility-Administered Medications:  .  ropivacaine (PF) 2 mg/mL (0.2%) (NAROPIN) injection 1 mL, 1 mL, Epidural, Once, [name], MD   Assessment and Plan: 1. Chronic musculoskeletal pain   2. Chronic pain in left shoulder   3. Chronic pain syndrome   4. Long term current use of opiate analgesic   5. Neuralgic facial pain   6. Bilateral hand pain    7. Cervicalgia   8. Chronic bilateral low back pain without sciatica   Based on our discussion today we will refill his medications for the next 2 months for November 15 and December 15.  I have reviewed the [facility] practitioner database information and it is appropriate.  We will have him scheduled for return to clinic in 2 months and he is instructed to contact us should he have any change in symptoms in the meantime.  Have also encouraged him to continue follow-up with his primary care physician for baseline medical care.  Follow Up Instructions:    I discussed the assessment and treatment plan with the patient. The patient was provided an opportunity to ask questions and all were answered. The patient agreed with the plan and demonstrated an understanding of the instructions.   The patient was advised to call back or seek an in-person evaluation if the symptoms worsen or if the condition fails to improve as anticipated.  I provided 30 minutes of non-face-to-face time during this encounter.   Molli Barrows, MD

## 2019-11-11 ENCOUNTER — Telehealth: Payer: Self-pay | Admitting: Anesthesiology

## 2019-11-11 NOTE — Telephone Encounter (Signed)
Patient is asking for Nurse to call him. His pharmacy did not have enough meds to fill his script. They need another script for the rest of his meds sent in so he can pick up.

## 2019-11-11 NOTE — Telephone Encounter (Signed)
Pharmacy only had #130 of the #150 he needs. Dr. Andree Elk will need to write another script for the 20 he was shorted.Pateint aware.

## 2019-11-19 MED ORDER — OXYCODONE HCL 10 MG PO TABS: 10.0000 mg | ORAL_TABLET | Freq: Every day | ORAL | 0 refills | Status: AC | PRN

## 2019-11-19 NOTE — Addendum Note (Signed)
Addended by: Molli Barrows on: 11/19/2019 01:17 PM   Modules accepted: Orders

## 2020-01-08 ENCOUNTER — Ambulatory Visit: Payer: Medicare Other | Attending: Anesthesiology | Admitting: Anesthesiology

## 2020-01-08 ENCOUNTER — Other Ambulatory Visit: Payer: Self-pay

## 2020-01-08 ENCOUNTER — Encounter: Payer: Self-pay | Admitting: Anesthesiology

## 2020-01-08 DIAGNOSIS — G518 Other disorders of facial nerve: Secondary | ICD-10-CM

## 2020-01-08 DIAGNOSIS — F1190 Opioid use, unspecified, uncomplicated: Secondary | ICD-10-CM

## 2020-01-08 DIAGNOSIS — G894 Chronic pain syndrome: Secondary | ICD-10-CM | POA: Diagnosis not present

## 2020-01-08 DIAGNOSIS — M7918 Myalgia, other site: Secondary | ICD-10-CM

## 2020-01-08 DIAGNOSIS — M542 Cervicalgia: Secondary | ICD-10-CM

## 2020-01-08 DIAGNOSIS — G8929 Other chronic pain: Secondary | ICD-10-CM

## 2020-01-08 DIAGNOSIS — M25512 Pain in left shoulder: Secondary | ICD-10-CM

## 2020-01-08 DIAGNOSIS — M545 Low back pain: Secondary | ICD-10-CM

## 2020-01-08 DIAGNOSIS — M79641 Pain in right hand: Secondary | ICD-10-CM

## 2020-01-08 DIAGNOSIS — M79642 Pain in left hand: Secondary | ICD-10-CM

## 2020-01-08 MED ORDER — OXYCODONE HCL 10 MG PO TABS: 10.0000 mg | ORAL_TABLET | ORAL | 0 refills | Status: DC

## 2020-01-08 MED ORDER — OXYCODONE HCL 10 MG PO TABS: 10.0000 mg | ORAL_TABLET | Freq: Four times a day (QID) | ORAL | 0 refills | Status: DC

## 2020-01-09 ENCOUNTER — Telehealth: Payer: Self-pay | Admitting: Anesthesiology

## 2020-01-09 MED ORDER — OXYCODONE HCL 10 MG PO TABS: 10.0000 mg | ORAL_TABLET | Freq: Every day | ORAL | 0 refills | Status: DC | PRN

## 2020-01-09 MED ORDER — OXYCODONE HCL 10 MG PO TABS: 10.0000 mg | ORAL_TABLET | Freq: Every day | ORAL | 0 refills | Status: AC | PRN

## 2020-01-09 NOTE — Telephone Encounter (Signed)
Pharmacy called stating patient's insurance will not pay for 150 tablets, please call to verify what is needed so patient can get his medications. Thank you

## 2020-01-09 NOTE — Progress Notes (Signed)
Virtual Visit via Video Note  I connected with Adam Andrade on 01/09/20 at  2:45 PM EST by a video enabled telemedicine application and verified that I am speaking with the correct person using two identifiers.  Location: Patient: Home Provider: [facility] control center   I discussed the limitations of evaluation and management by telemedicine and the availability of in person appointments. The patient expressed understanding and agreed to proceed.  History of Present Illness: I spoke with Adam Andrade regarding his diffuse body pain and chronic low back pain via telephone for his virtual conference.  He was unable to do the video portion.  He reports that he is doing well with his current medication management regimen.  No changes in the quality characteristic or distribution of his pain are noted.  Otherwise has been stable.  He feels like he is doing well with this regimen and is taking his medications as prescribed.  No side effects are reported.  His strength is been well preserved.    Observations/Objective:  Current Outpatient Medications:  .  amiodarone (PACERONE) 200 MG tablet, Take 200 mg by mouth daily. , Disp: , Rfl:  .  apixaban (ELIQUIS) 5 MG TABS tablet, Take 5 mg by mouth 2 (two) times daily. , Disp: , Rfl:  .  atorvastatin (LIPITOR) 40 MG tablet, Take 40 mg by mouth at bedtime., Disp: , Rfl:  .  chlorhexidine (PERIDEX) 0.12 % solution, 15 mLs by Mouth Rinse route 2 (two) times daily., Disp: , Rfl:  .  clobetasol ointment (TEMOVATE) 2.44 %, Apply 1 application topically daily as needed (psoriasis). , Disp: , Rfl:  .  fenofibrate (TRICOR) 145 MG tablet, Take 145 mg by mouth at bedtime. , Disp: , Rfl:  .  Guselkumab (TREMFYA) 100 MG/ML SOPN, Inject 100 mg into the skin every 8 (eight) weeks. , Disp: , Rfl:  .  lansoprazole (PREVACID) 30 MG capsule, Take 30 mg by mouth every morning. , Disp: , Rfl:  .  levothyroxine (SYNTHROID, LEVOTHROID) 50 MCG tablet, Take 50 mcg by mouth daily  before breakfast. , Disp: , Rfl:  .  linagliptin (TRADJENTA) 5 MG TABS tablet, Take 5 mg by mouth daily. , Disp: , Rfl:  .  losartan (COZAAR) 25 MG tablet, Take 25 mg by mouth daily., Disp: , Rfl:  .  Magnesium 400 MG CAPS, Take 400 mg by mouth 2 (two) times daily., Disp: , Rfl:  .  meloxicam (MOBIC) 15 MG tablet, Take 15 mg by mouth daily. , Disp: , Rfl:  .  metoprolol succinate (TOPROL-XL) 25 MG 24 hr tablet, Take 50 mg by mouth daily. Take with or immediately following a meal. , Disp: , Rfl:  .  naloxone (NARCAN) nasal spray 4 mg/0.1 mL, Place 1 spray into the nose as needed (opioid overdose). , Disp: , Rfl:  .  Oxycodone HCl 10 MG TABS, Take 1 tablet (10 mg total) by mouth 5 (five) times daily as needed., Disp: 150 tablet, Rfl: 0 .  [START ON 02/08/2020] Oxycodone HCl 10 MG TABS, Take 1 tablet (10 mg total) by mouth 5 (five) times daily as needed., Disp: 150 tablet, Rfl: 0 .  sitaGLIPtin-metformin (JANUMET) 50-1000 MG tablet, Take 1 tablet by mouth 2 (two) times daily with a meal. , Disp: , Rfl:  .  torsemide (DEMADEX) 20 MG tablet, Take 60-80 mg by mouth See admin instructions. Alternate between 60mg  and 80mg  every other day., Disp: , Rfl:  .  valACYclovir (VALTREX) 500 MG tablet,  Take 2,000 mg by mouth See admin instructions. Take 4 tablets (2,000mg ) by mouth as directed at onset of cold sore., Disp: , Rfl:  .  venlafaxine XR (EFFEXOR-XR) 75 MG 24 hr capsule, Take 75 mg by mouth daily. , Disp: , Rfl: 1  Current Facility-Administered Medications:  .  ropivacaine (PF) 2 mg/mL (0.2%) (NAROPIN) injection 1 mL, 1 mL, Epidural, Once, [name], MD  Assessment and Plan:  1. Chronic musculoskeletal pain   2. Chronic pain in left shoulder   3. Chronic pain syndrome   4. Neuralgic facial pain   5. Bilateral hand pain   6. Cervicalgia   7. Chronic bilateral low back pain without sciatica   8. Pain in joint of left shoulder   9. Chronic, continuous use of opioids   As discussed today and  upon review of the [place] practitioner database information I am going to refill his medications for January 14 and February 13.  We will schedule him for return to clinic in 2 months.  Should he have any problems with his pain management in the meantime he is instructed to contact us.  He is to continue follow-up with his primary care physicians for his baseline medical care. Follow Up Instructions:    I discussed the assessment and treatment plan with the patient. The patient was provided an opportunity to ask questions and all were answered. The patient agreed with the plan and demonstrated an understanding of the instructions.   The patient was advised to call back or seek an in-person evaluation if the symptoms worsen or if the condition fails to improve as anticipated.  I provided 30 minutes of non-face-to-face time during this encounter.   Molli Barrows, MD

## 2020-03-03 ENCOUNTER — Ambulatory Visit: Payer: BC Managed Care – PPO | Attending: Anesthesiology | Admitting: Anesthesiology

## 2020-03-03 ENCOUNTER — Other Ambulatory Visit: Payer: Self-pay

## 2020-03-03 DIAGNOSIS — M79642 Pain in left hand: Secondary | ICD-10-CM

## 2020-03-03 DIAGNOSIS — G8929 Other chronic pain: Secondary | ICD-10-CM

## 2020-03-03 DIAGNOSIS — G894 Chronic pain syndrome: Secondary | ICD-10-CM | POA: Diagnosis not present

## 2020-03-03 DIAGNOSIS — M25512 Pain in left shoulder: Secondary | ICD-10-CM

## 2020-03-03 DIAGNOSIS — G518 Other disorders of facial nerve: Secondary | ICD-10-CM

## 2020-03-03 DIAGNOSIS — M79641 Pain in right hand: Secondary | ICD-10-CM

## 2020-03-03 DIAGNOSIS — Z79891 Long term (current) use of opiate analgesic: Secondary | ICD-10-CM

## 2020-03-03 DIAGNOSIS — M7918 Myalgia, other site: Secondary | ICD-10-CM

## 2020-03-03 DIAGNOSIS — M542 Cervicalgia: Secondary | ICD-10-CM

## 2020-03-03 DIAGNOSIS — F1190 Opioid use, unspecified, uncomplicated: Secondary | ICD-10-CM

## 2020-03-03 MED ORDER — OXYCODONE HCL 10 MG PO TABS: 10.0000 mg | ORAL_TABLET | Freq: Every day | ORAL | 0 refills | Status: DC | PRN

## 2020-03-03 MED ORDER — OXYCODONE HCL 10 MG PO TABS: 10.0000 mg | ORAL_TABLET | Freq: Every day | ORAL | 0 refills | Status: DC

## 2020-03-04 ENCOUNTER — Encounter: Payer: Self-pay | Admitting: Anesthesiology

## 2020-03-04 NOTE — Progress Notes (Signed)
Virtual Visit via Telephone Note  I connected with Adam Andrade on 03/04/20 at  1:15 PM EST by telephone and verified that I am speaking with the correct person using two identifiers.  Location: Patient: Home Provider: Pain clinic   I discussed the limitations, risks, security and privacy concerns of performing an evaluation and management service by telephone and the availability of in person appointments. I also discussed with the patient that there may be a patient responsible charge related to this service. The patient expressed understanding and agreed to proceed.   History of Present Illness: I spoke with Adam Andrade via telephone for his virtual visit.  He was unable to do the video portion of this.  He reports that he has been doing reasonably well with exercise and activity and in regards to his back pain and shoulder pain.  The quality characteristic distribution been stable in nature.  No new facial or jaw pain is noted.  Overall he feels like he is doing reasonably well with his current pain syndrome and taking his medications as prescribed which continues to give him good relief.  No side effects reported with the medications he is taking.  Otherwise he is in his usual state of health at this time.    Observations/Objective:  Current Outpatient Medications:  .  amiodarone (PACERONE) 200 MG tablet, Take 200 mg by mouth daily. , Disp: , Rfl:  .  apixaban (ELIQUIS) 5 MG TABS tablet, Take 5 mg by mouth 2 (two) times daily. , Disp: , Rfl:  .  atorvastatin (LIPITOR) 40 MG tablet, Take 40 mg by mouth at bedtime., Disp: , Rfl:  .  chlorhexidine (PERIDEX) 0.12 % solution, 15 mLs by Mouth Rinse route 2 (two) times daily., Disp: , Rfl:  .  clobetasol ointment (TEMOVATE) 5.36 %, Apply 1 application topically daily as needed (psoriasis). , Disp: , Rfl:  .  fenofibrate (TRICOR) 145 MG tablet, Take 145 mg by mouth at bedtime. , Disp: , Rfl:  .  Guselkumab (TREMFYA) 100 MG/ML SOPN, Inject 100 mg  into the skin every 8 (eight) weeks. , Disp: , Rfl:  .  lansoprazole (PREVACID) 30 MG capsule, Take 30 mg by mouth every morning. , Disp: , Rfl:  .  levothyroxine (SYNTHROID, LEVOTHROID) 50 MCG tablet, Take 50 mcg by mouth daily before breakfast. , Disp: , Rfl:  .  linagliptin (TRADJENTA) 5 MG TABS tablet, Take 5 mg by mouth daily. , Disp: , Rfl:  .  losartan (COZAAR) 25 MG tablet, Take 25 mg by mouth daily., Disp: , Rfl:  .  Magnesium 400 MG CAPS, Take 400 mg by mouth 2 (two) times daily., Disp: , Rfl:  .  meloxicam (MOBIC) 15 MG tablet, Take 15 mg by mouth daily. , Disp: , Rfl:  .  metoprolol succinate (TOPROL-XL) 25 MG 24 hr tablet, Take 50 mg by mouth daily. Take with or immediately following a meal. , Disp: , Rfl:  .  naloxone (NARCAN) nasal spray 4 mg/0.1 mL, Place 1 spray into the nose as needed (opioid overdose). , Disp: , Rfl:  .  [START ON 03/09/2020] Oxycodone HCl 10 MG TABS, Take 1 tablet (10 mg total) by mouth 5 (five) times daily as needed., Disp: 150 tablet, Rfl: 0 .  [START ON 04/08/2020] Oxycodone HCl 10 MG TABS, Take 1 tablet (10 mg total) by mouth 5 (five) times daily., Disp: 150 tablet, Rfl: 0 .  sitaGLIPtin-metformin (JANUMET) 50-1000 MG tablet, Take 1 tablet by mouth 2 (  two) times daily with a meal. , Disp: , Rfl:  .  torsemide (DEMADEX) 20 MG tablet, Take 60-80 mg by mouth See admin instructions. Alternate between 60mg  and 80mg  every other day., Disp: , Rfl:  .  valACYclovir (VALTREX) 500 MG tablet, Take 2,000 mg by mouth See admin instructions. Take 4 tablets (2,000mg ) by mouth as directed at onset of cold sore., Disp: , Rfl:  .  venlafaxine XR (EFFEXOR-XR) 75 MG 24 hr capsule, Take 75 mg by mouth daily. , Disp: , Rfl: 1  Current Facility-Administered Medications:  .  ropivacaine (PF) 2 mg/mL (0.2%) (NAROPIN) injection 1 mL, 1 mL, Epidural, Once, [name], MD  Assessment and Plan: 1. Chronic pain in left shoulder   2. Chronic pain syndrome   3. Chronic, continuous  use of opioids   4. Cervicalgia   5. Bilateral hand pain   6. Neuralgic facial pain   7. Chronic musculoskeletal pain   8. Pain in joint of left shoulder   Based on our discussion today I think it is appropriate to refill his medications for the next 2 months these will be scheduled for March 15 and April 14.  He continues to do well with this regimen with no side effects reported and good functional lifestyle improvement.  I have reviewed the [facility] practitioner database information and it is appropriate.  We will schedule him for 22-month return to clinic for refill fill and reevaluation.  I want him to continue follow-up with his primary care physicians for his baseline medical care.  Follow Up Instructions:    I discussed the assessment and treatment plan with the patient. The patient was provided an opportunity to ask questions and all were answered. The patient agreed with the plan and demonstrated an understanding of the instructions.   The patient was advised to call back or seek an in-person evaluation if the symptoms worsen or if the condition fails to improve as anticipated.  I provided 30 minutes of non-face-to-face time during this encounter.   Molli Barrows, MD

## 2020-03-09 ENCOUNTER — Telehealth: Payer: Self-pay | Admitting: Anesthesiology

## 2020-03-09 NOTE — Telephone Encounter (Signed)
Pt called and stated that when he got his Oxycodone filled the pharmacy only had 30 and he is supposed to get 150. He stated that he needs another rx for the remaining to be filled whenever they get more in stock. [facility] uses [facility] on [address] in [place].

## 2020-03-13 ENCOUNTER — Telehealth: Payer: Self-pay | Admitting: *Deleted

## 2020-03-13 MED ORDER — OXYCODONE HCL 10 MG PO TABS: 10.0000 mg | ORAL_TABLET | Freq: Every day | ORAL | 0 refills | Status: AC | PRN

## 2020-03-13 NOTE — Addendum Note (Signed)
Addended by: Molli Barrows on: 03/13/2020 03:09 PM   Modules accepted: Orders

## 2020-05-05 ENCOUNTER — Encounter: Payer: Self-pay | Admitting: Anesthesiology

## 2020-05-05 ENCOUNTER — Other Ambulatory Visit: Payer: Self-pay

## 2020-05-05 ENCOUNTER — Ambulatory Visit: Payer: BC Managed Care – PPO | Attending: Anesthesiology | Admitting: Anesthesiology

## 2020-05-05 VITALS — BP 104/64 | HR 88 | Temp 97.4°F | Resp 18 | Ht 70.0 in | Wt 256.0 lb

## 2020-05-05 DIAGNOSIS — M79642 Pain in left hand: Secondary | ICD-10-CM | POA: Insufficient documentation

## 2020-05-05 DIAGNOSIS — M79641 Pain in right hand: Secondary | ICD-10-CM | POA: Diagnosis present

## 2020-05-05 DIAGNOSIS — G8929 Other chronic pain: Secondary | ICD-10-CM | POA: Diagnosis present

## 2020-05-05 DIAGNOSIS — G518 Other disorders of facial nerve: Secondary | ICD-10-CM | POA: Diagnosis present

## 2020-05-05 DIAGNOSIS — M7918 Myalgia, other site: Secondary | ICD-10-CM | POA: Diagnosis present

## 2020-05-05 DIAGNOSIS — M25512 Pain in left shoulder: Secondary | ICD-10-CM | POA: Insufficient documentation

## 2020-05-05 DIAGNOSIS — G894 Chronic pain syndrome: Secondary | ICD-10-CM | POA: Insufficient documentation

## 2020-05-05 DIAGNOSIS — F1190 Opioid use, unspecified, uncomplicated: Secondary | ICD-10-CM | POA: Insufficient documentation

## 2020-05-05 DIAGNOSIS — M542 Cervicalgia: Secondary | ICD-10-CM | POA: Insufficient documentation

## 2020-05-05 MED ORDER — OXYCODONE HCL 10 MG PO TABS: 10.0000 mg | ORAL_TABLET | ORAL | 0 refills | Status: AC | PRN

## 2020-05-05 NOTE — Addendum Note (Signed)
Addended by: Landis Martins on: 05/05/2020 03:50 PM   Modules accepted: Orders

## 2020-05-05 NOTE — Patient Instructions (Signed)
Trigger Point Injections Patient Information  Description: Trigger points are areas of muscle sensitive to touch which cause pain with movement, sometimes felt some distance from the site of palpation.  Usually the muscle containing these trigger points if felt as a tight band or knot.   The area of maximum tenderness or trigger point is identified, and after antiseptic preparation of the skin, a small needle is placed into this site.  Reproduction of the pain often occurs and numbing medicine (local anesthetic) is injected into the site, sometimes along with steroid preparation.  The entire block usually lasts less than 5 minutes.  Conditions which may be treated by trigger points:   Muscular pain and spasm  Nerve irritation  Preparation for the injection:  1. Do not eat any solid food or dairy products within 8 hours of your appointment. 2. You may drink clear liquids up to 3 hours before appointment.  Clear liquids include water, black coffee, juice or soda.  No milk or cream please. 3. You may take your regular medications, including pain medications, with a sip of water before your appointment.  Diabetics should hold regular insulin ( if take separately) and take 1/2 normal NPH dose the morning of the procedure.  Carry some sugar containing items with you to your appointment. 4. A driver must accompany you and be prepared to drive you home after your procedure.  5. Bring all your current medications with you. 6. An IV may be inserted and sedation may be given at the discretion of the physician.  7. A blood pressure cuff, EKG, and other monitors will often be applied during the procedure.  Some patients may need to have extra oxygen administered for a short period. 8. You will be asked to provide medical information, including your allergies and medications, prior to the procedure.  We must know immediately if you are taking blood thinners (like Coumadin/Warfarin) or if you are allergic to  IV iodine contrast (dye).  We must know if you could possibly be pregnant.  Possible side-effects:   Bleeding from needle site  Infection (rare, may require surgery)  Nerve injury (rare)  Numbness & tingling (temporary)  Punctured lung (if injection around chest)  Light-headedness (temporary)  Pain at injection site (several days)  Decreased blood pressure (rare, temporary)  Weakness in arm/leg (temporary)  Call if you experience:   Hive or difficulty breathing (go to the emergency room)  Inflammation or drainage at the injection site(s)  Please note:  Although the local anesthetic injected can often make your painful muscle feel good for several hours after the injection, the pain may return.  It takes 3-7 days for steroids to work.  You may not notice any pain relief for at least one week.  If effective, we will often do a series of injections spaced 3-6 weeks apart to maximally decrease your pain.  If you have any questions please call [phone] [facility]

## 2020-05-05 NOTE — Progress Notes (Signed)
Subjective:  Patient ID: Adam Andrade, male    DOB: 03/23/1961  Age: 59 y.o. MRN: [id number]  CC: Neck Pain and Back Pain   Procedure: None  HPI Adam Andrade presents for reevaluation.  He was last seen a few months ago and has been having a significant increase in his left posterior lateral shoulder pain.  This is a chronic issue but has recently been more severe.  It is mainly a gnawing aching pain in the left posterior trapezius and posterior neck.  Occasionally radiates into the right posterior trapezius as well.  He has some chronic weakness in the left shoulder but his strength is been stable in nature.  He has been utilizing his oxycodone generally 5 times a day and has had breakthrough despite that.  Otherwise his oxycodone is well-tolerated with no side effects reported.  He is inquiring today as to whether he might increase his dosing.  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  I did review his cervical CT scan from 2015 with him today.  Outpatient Medications Prior to Visit  Medication Sig Dispense Refill  . amiodarone (PACERONE) 200 MG tablet Take 200 mg by mouth daily.     Marland Kitchen apixaban (ELIQUIS) 5 MG TABS tablet Take 5 mg by mouth 2 (two) times daily.     Marland Kitchen atorvastatin (LIPITOR) 40 MG tablet Take 40 mg by mouth at bedtime.    . chlorhexidine (PERIDEX) 0.12 % solution 15 mLs by Mouth Rinse route 2 (two) times daily.    . digoxin (LANOXIN) 0.125 MG tablet Take by mouth.    . empagliflozin (JARDIANCE) 10 MG TABS tablet Jardiance 10 mg tablet  TAKE 1 TABLET BY MOUTH DAILY WITH BREAKFAST    . fenofibrate (TRICOR) 145 MG tablet Take 145 mg by mouth at bedtime.     . lansoprazole (PREVACID) 30 MG capsule Take 30 mg by mouth every morning.     Marland Kitchen levothyroxine (SYNTHROID, LEVOTHROID) 50 MCG tablet Take 50 mcg  by mouth daily before breakfast.     . linagliptin (TRADJENTA) 5 MG TABS tablet Take 5 mg by mouth daily.     Marland Kitchen losartan (COZAAR) 25 MG tablet Take 25 mg by mouth daily.    . Magnesium 400 MG CAPS Take 400 mg by mouth 2 (two) times daily.    . meloxicam (MOBIC) 15 MG tablet Take 15 mg by mouth daily.     . metolazone (ZAROXOLYN) 2.5 MG tablet Take 2.5 mg by mouth daily.    . naloxone (NARCAN) nasal spray 4 mg/0.1 mL Place 1 spray into the nose as needed (opioid overdose).     Marland Kitchen sitaGLIPtin-metformin (JANUMET) 50-1000 MG tablet Take 1 tablet by mouth 2 (two) times daily with a meal.     . spironolactone (ALDACTONE) 25 MG tablet Take 25 mg by mouth daily.    Marland Kitchen torsemide (DEMADEX) 20 MG tablet Take 60-80 mg by mouth See admin instructions. Alternate between 60mg  and 80mg  every other day.    . valACYclovir (VALTREX) 500 MG tablet Take 2,000 mg by mouth See admin instructions. Take 4 tablets (2,000mg ) by mouth as directed at onset of cold sore.    . venlafaxine XR (EFFEXOR-XR) 75 MG 24 hr capsule Take 75 mg by mouth daily.   1  . Oxycodone HCl 10 MG  TABS Take 1 tablet (10 mg total) by mouth 5 (five) times daily. 150 tablet 0  . clobetasol ointment (TEMOVATE) 5.80 % Apply 1 application topically daily as needed (psoriasis).     . Guselkumab (TREMFYA) 100 MG/ML SOPN Inject 100 mg into the skin every 8 (eight) weeks.     . metoprolol succinate (TOPROL-XL) 25 MG 24 hr tablet Take 50 mg by mouth daily. Take with or immediately following a meal.      Facility-Administered Medications Prior to Visit  Medication Dose Route Frequency Provider Last Rate Last Admin  . ropivacaine (PF) 2 mg/mL (0.2%) (NAROPIN) injection 1 mL  1 mL Epidural Once Molli Barrows, MD        Review of Systems CNS: No confusion or sedation Cardiac: No angina or palpitations GI: No abdominal pain or constipation Constitutional: No nausea vomiting fevers or chills  Objective:  BP 104/64   Pulse 88   Temp (!) 97.4 F (36.3 C)    Resp 18   Ht 5\' 10"  (1.778 m)   Wt 256 lb (116.1 kg)   SpO2 98%   BMI 36.73 kg/m    BP Readings from Last 3 Encounters:  05/05/20 104/64  03/13/19 98/76  02/18/19 108/75     Wt Readings from Last 3 Encounters:  05/05/20 256 lb (116.1 kg)  03/13/19 260 lb (117.9 kg)  02/18/19 260 lb (117.9 kg)     Physical Exam Pt is alert and oriented PERRL EOMI HEART IS RRR no murmur or rub LCTA no wheezing or rales MUSCULOSKELETAL reveals some paraspinous muscle tenderness in the splenius capitis muscles left and right.  He has a trigger point in the left mid body trapezius on the left shoulder.  His strength appears to be well preserved on the left side.  Muscle tone and bulk is per baseline his strength appears to be well preserved on the right side as well.  Labs  No results found for: HGBA1C Lab Results  Component Value Date   CREATININE 2.08 (H) 02/13/2019    -------------------------------------------------------------------------------------------------------------------- Lab Results  Component Value Date   WBC 7.5 02/13/2019   HGB 11.8 (L) 02/13/2019   HCT 37.2 (L) 02/13/2019   PLT 316 02/13/2019   GLUCOSE 142 (H) 02/13/2019   ALT 12 10/06/2013   AST 17 10/06/2013   NA 139 02/13/2019   K 4.3 02/13/2019   CL 102 02/13/2019   CREATININE 2.08 (H) 02/13/2019   BUN 26 (H) 02/13/2019   CO2 25 02/13/2019   INR 1.24 02/18/2019    --------------------------------------------------------------------------------------------------------------------- No results found.   Assessment & Plan:   Adam Andrade was seen today for neck pain and back pain.  Diagnoses and all orders for this visit:  Chronic pain in left shoulder -     INJECT TRIGGER POINT, 1 OR 2  Chronic pain syndrome  Chronic, continuous use of opioids  Cervicalgia -     INJECT TRIGGER POINT, 1 OR 2  Bilateral hand pain  Neuralgic facial pain  Chronic musculoskeletal pain -     INJECT TRIGGER POINT, 1 OR  2  Other orders -     Oxycodone HCl 10 MG TABS; Take 1 tablet (10 mg total) by mouth every 4 (four) hours as needed.        ----------------------------------------------------------------------------------------------------------------------  Problem List Items Addressed This Visit      Unprioritized   Chronic musculoskeletal pain   Relevant Medications   Oxycodone HCl 10 MG TABS (Start on 05/11/2020)   Other  Relevant Orders   INJECT TRIGGER POINT, 1 OR 2   Chronic pain in left shoulder - Primary   Relevant Medications   Oxycodone HCl 10 MG TABS (Start on 05/11/2020)   Other Relevant Orders   INJECT TRIGGER POINT, 1 OR 2   Chronic pain syndrome   Relevant Medications   Oxycodone HCl 10 MG TABS (Start on 05/11/2020)    Other Visit Diagnoses    Chronic, continuous use of opioids       Cervicalgia       Relevant Orders   INJECT TRIGGER POINT, 1 OR 2   Bilateral hand pain       Neuralgic facial pain            ----------------------------------------------------------------------------------------------------------------------  1. Chronic pain in left shoulder Unfortunately he had his vaccine for Covid yesterday and we will subsequently defer on a trigger point injection for the left shoulder for 2 weeks.  I did talk to him about some rotator cuff exercises some stretches as well. - INJECT TRIGGER POINT, 1 OR 2  2. Chronic pain syndrome I have reviewed the [facility] practitioner database information and it is appropriate for refill.  Based on his body mass index I am going to allow him acute 4-hour as needed dosing on the oxycodone 10 mg tablets for the next month.  This would be averaging 5 to 6 tablets/day.  We will do this for the next month.  I did talk to him about the risks and benefits of chronic opioid therapy and potential for hyperalgesia was explained.  3. Chronic, continuous use of opioids As above  4. Cervicalgia As above - INJECT TRIGGER POINT, 1 OR  2  5. Bilateral hand pain Continue follow-up with his rheumatologist  6. Neuralgic facial pain As above and continue follow-up with [facility] oncologic therapy.  7. Chronic musculoskeletal pain  - INJECT TRIGGER POINT, 1 OR 2    ----------------------------------------------------------------------------------------------------------------------  I have changed Adam Andrade's Oxycodone HCl. I am also having him maintain his Tradjenta, fenofibrate, lansoprazole, clobetasol ointment, sitaGLIPtin-metformin, naloxone, venlafaxine XR, apixaban, losartan, torsemide, Guselkumab, meloxicam, amiodarone, atorvastatin, levothyroxine, Magnesium, metoprolol succinate, chlorhexidine, valACYclovir, spironolactone, metolazone, digoxin, and Jardiance. We will continue to administer ropivacaine (PF) 2 mg/mL (0.2%).   Meds ordered this encounter  Medications  . Oxycodone HCl 10 MG TABS    Sig: Take 1 tablet (10 mg total) by mouth every 4 (four) hours as needed.    Dispense:  165 tablet    Refill:  0   Patient's Medications  New Prescriptions   No medications on file  Previous Medications   AMIODARONE (PACERONE) 200 MG TABLET    Take 200 mg by mouth daily.    APIXABAN (ELIQUIS) 5 MG TABS TABLET    Take 5 mg by mouth 2 (two) times daily.    ATORVASTATIN (LIPITOR) 40 MG TABLET    Take 40 mg by mouth at bedtime.   CHLORHEXIDINE (PERIDEX) 0.12 % SOLUTION    15 mLs by Mouth Rinse route 2 (two) times daily.   CLOBETASOL OINTMENT (TEMOVATE) 0.05 %    Apply 1 application topically daily as needed (psoriasis).    DIGOXIN (LANOXIN) 0.125 MG TABLET    Take by mouth.   EMPAGLIFLOZIN (JARDIANCE) 10 MG TABS TABLET    Jardiance 10 mg tablet  TAKE 1 TABLET BY MOUTH DAILY WITH BREAKFAST   FENOFIBRATE (TRICOR) 145 MG TABLET    Take 145 mg by mouth at bedtime.    GUSELKUMAB (TREMFYA)  100 MG/ML SOPN    Inject 100 mg into the skin every 8 (eight) weeks.    LANSOPRAZOLE (PREVACID) 30 MG CAPSULE    Take 30 mg by mouth  every morning.    LEVOTHYROXINE (SYNTHROID, LEVOTHROID) 50 MCG TABLET    Take 50 mcg by mouth daily before breakfast.    LINAGLIPTIN (TRADJENTA) 5 MG TABS TABLET    Take 5 mg by mouth daily.    LOSARTAN (COZAAR) 25 MG TABLET    Take 25 mg by mouth daily.   MAGNESIUM 400 MG CAPS    Take 400 mg by mouth 2 (two) times daily.   MELOXICAM (MOBIC) 15 MG TABLET    Take 15 mg by mouth daily.    METOLAZONE (ZAROXOLYN) 2.5 MG TABLET    Take 2.5 mg by mouth daily.   METOPROLOL SUCCINATE (TOPROL-XL) 25 MG 24 HR TABLET    Take 50 mg by mouth daily. Take with or immediately following a meal.    NALOXONE (NARCAN) NASAL SPRAY 4 MG/0.1 ML    Place 1 spray into the nose as needed (opioid overdose).    SITAGLIPTIN-METFORMIN (JANUMET) 50-1000 MG TABLET    Take 1 tablet by mouth 2 (two) times daily with a meal.    SPIRONOLACTONE (ALDACTONE) 25 MG TABLET    Take 25 mg by mouth daily.   TORSEMIDE (DEMADEX) 20 MG TABLET    Take 60-80 mg by mouth See admin instructions. Alternate between 60mg  and 80mg  every other day.   VALACYCLOVIR (VALTREX) 500 MG TABLET    Take 2,000 mg by mouth See admin instructions. Take 4 tablets (2,000mg ) by mouth as directed at onset of cold sore.   VENLAFAXINE XR (EFFEXOR-XR) 75 MG 24 HR CAPSULE    Take 75 mg by mouth daily.   Modified Medications   Modified Medication Previous Medication   OXYCODONE HCL 10 MG TABS Oxycodone HCl 10 MG TABS      Take 1 tablet (10 mg total) by mouth every 4 (four) hours as needed.    Take 1 tablet (10 mg total) by mouth 5 (five) times daily.  Discontinued Medications   No medications on file   ----------------------------------------------------------------------------------------------------------------------  Follow-up: Return in about 2 weeks (around 05/19/2020) for procedure, evaluation.    Molli Barrows, MD

## 2020-05-05 NOTE — Progress Notes (Signed)
Nursing Pain Medication Assessment:  Safety precautions to be maintained throughout the outpatient stay will include: orient to surroundings, keep bed in low position, maintain call bell within reach at all times, provide assistance with transfer out of bed and ambulation.  Medication Inspection Compliance: Pill count conducted under aseptic conditions, in front of the patient. Neither the pills nor the bottle was removed from the patient's sight at any time. Once count was completed pills were immediately returned to the patient in their original bottle.  Medication: Oxycodone IR Pill/Patch Count: 38.5 of 150 pills remain Pill/Patch Appearance: Markings consistent with prescribed medication Bottle Appearance: Standard pharmacy container. Clearly labeled. Filled Date: 04 /19 / 2021 Last Medication intake:  Today

## 2020-05-09 LAB — TOXASSURE SELECT 13 (MW), URINE

## 2020-05-11 ENCOUNTER — Telehealth: Payer: Self-pay | Admitting: Anesthesiology

## 2020-05-11 NOTE — Telephone Encounter (Signed)
No answer. LVM telling patient that I would talk to Dr. Andree Elk tomorrow about this.

## 2020-05-11 NOTE — Telephone Encounter (Signed)
[facility] . Can [facility] be changed [facility] [address] [facility] and he would like script to be sent there if possible. Please call patient and let him know when this is done.

## 2020-05-12 NOTE — Telephone Encounter (Signed)
I called patient again today to confirm that he needs for his script to be changed to another pharmacy. No answer. Left second voicemail for him to return call.

## 2020-06-11 ENCOUNTER — Ambulatory Visit (HOSPITAL_BASED_OUTPATIENT_CLINIC_OR_DEPARTMENT_OTHER): Payer: BC Managed Care – PPO | Admitting: Anesthesiology

## 2020-06-11 ENCOUNTER — Telehealth: Payer: Self-pay | Admitting: Anesthesiology

## 2020-06-11 ENCOUNTER — Ambulatory Visit
Admission: RE | Admit: 2020-06-11 | Discharge: 2020-06-11 | Disposition: A | Discharge status: Home / Self Care | Payer: BC Managed Care – PPO | Source: Ambulatory Visit | Attending: Anesthesiology | Admitting: Anesthesiology

## 2020-06-11 ENCOUNTER — Other Ambulatory Visit: Payer: Self-pay | Admitting: Anesthesiology

## 2020-06-11 ENCOUNTER — Encounter: Payer: Self-pay | Admitting: Anesthesiology

## 2020-06-11 ENCOUNTER — Other Ambulatory Visit: Payer: Self-pay

## 2020-06-11 VITALS — BP 99/76 | HR 92 | Temp 98.4°F | Resp 16 | Ht 70.0 in | Wt 250.0 lb

## 2020-06-11 DIAGNOSIS — M25512 Pain in left shoulder: Secondary | ICD-10-CM

## 2020-06-11 DIAGNOSIS — M542 Cervicalgia: Secondary | ICD-10-CM

## 2020-06-11 DIAGNOSIS — G8929 Other chronic pain: Secondary | ICD-10-CM | POA: Insufficient documentation

## 2020-06-11 DIAGNOSIS — M545 Low back pain: Secondary | ICD-10-CM | POA: Insufficient documentation

## 2020-06-11 DIAGNOSIS — M7918 Myalgia, other site: Secondary | ICD-10-CM

## 2020-06-11 DIAGNOSIS — F1190 Opioid use, unspecified, uncomplicated: Secondary | ICD-10-CM

## 2020-06-11 DIAGNOSIS — G894 Chronic pain syndrome: Secondary | ICD-10-CM | POA: Diagnosis not present

## 2020-06-11 DIAGNOSIS — M79641 Pain in right hand: Secondary | ICD-10-CM | POA: Diagnosis present

## 2020-06-11 DIAGNOSIS — R52 Pain, unspecified: Secondary | ICD-10-CM | POA: Diagnosis present

## 2020-06-11 DIAGNOSIS — G518 Other disorders of facial nerve: Secondary | ICD-10-CM | POA: Diagnosis present

## 2020-06-11 DIAGNOSIS — M79642 Pain in left hand: Secondary | ICD-10-CM | POA: Diagnosis present

## 2020-06-11 MED ORDER — OXYCODONE HCL 10 MG PO TABS: 10.0000 mg | ORAL_TABLET | ORAL | 0 refills | Status: AC | PRN

## 2020-06-11 MED ORDER — DEXAMETHASONE SODIUM PHOSPHATE 10 MG/ML IJ SOLN
INTRAMUSCULAR | Status: AC
  Filled 2020-06-11: qty 1

## 2020-06-11 MED ORDER — ROPIVACAINE HCL 2 MG/ML IJ SOLN
INTRAMUSCULAR | Status: AC
  Filled 2020-06-11: qty 10

## 2020-06-11 MED ORDER — OXYCODONE HCL 10 MG PO TABS: 10.0000 mg | ORAL_TABLET | ORAL | 0 refills | Status: DC | PRN

## 2020-06-11 MED ORDER — DEXAMETHASONE SODIUM PHOSPHATE 10 MG/ML IJ SOLN: 10.0000 mg | Freq: Once | INTRAMUSCULAR | Status: DC

## 2020-06-11 MED ORDER — ROPIVACAINE HCL 2 MG/ML IJ SOLN: 10.0000 mL | Freq: Once | INTRAMUSCULAR | Status: DC

## 2020-06-11 NOTE — Telephone Encounter (Signed)
Jarl needs meds sent to [facility] [address] in [place], [facility] and he is going to change his pharmacy to [facility] [address] in [place].

## 2020-06-11 NOTE — Telephone Encounter (Signed)
Dr Andree Elk notified and pharmacy changed in [place].

## 2020-06-11 NOTE — Progress Notes (Signed)
Nursing Pain Medication Assessment:  Safety precautions to be maintained throughout the outpatient stay will include: orient to surroundings, keep bed in low position, maintain call bell within reach at all times, provide assistance with transfer out of bed and ambulation.  Medication Inspection Compliance: Pill count conducted under aseptic conditions, in front of the patient. Neither the pills nor the bottle was removed from the patient's sight at any time. Once count was completed pills were immediately returned to the patient in their original bottle.  Medication: Oxycodone IR Pill/Patch Count: 3 of 165 pills remain Pill/Patch Appearance: Markings consistent with prescribed medication Bottle Appearance: Standard pharmacy container. Clearly labeled. Filled Date: 05 / 18 / 2021  Last Medication intake:  Today

## 2020-06-11 NOTE — Progress Notes (Signed)
Subjective:  Patient ID: Adam Andrade, male    DOB: 18-May-1961  Age: 59 y.o. MRN: [id number]  CC: Shoulder Pain (left)   Procedure: Trigger point injection to the left lateral deltoid and left mid body trapezius as well as the left splenius capitis   HPI Adam Andrade presents for reevaluation.  He is complaining of severe pain in the left shoulder and neck.  This pain has been present before and he has had trigger point injections for this giving significant improvement in pain control.  He still taking his medications as prescribed but generally breaks through with the opioid medications.  Unfortunately Adam Andrade has required chronic opioid therapy as conservative therapy and physical therapy or anti-inflammatory medications have been insufficient to keep his pain under good control.  He is been very compliant with the regimen showing no evidence of any diversion or illicit use.  Despite efforts at stretching strengthening the left shoulder he continues to have intermittent pain generally well treated with trigger point injections.  He is requesting these today.  No change in upper extremity strength or function are noted at this time  Outpatient Medications Prior to Visit  Medication Sig Dispense Refill  . amiodarone (PACERONE) 200 MG tablet Take 200 mg by mouth daily.     Marland Kitchen apixaban (ELIQUIS) 5 MG TABS tablet Take 5 mg by mouth 2 (two) times daily.     Marland Kitchen atorvastatin (LIPITOR) 40 MG tablet Take 40 mg by mouth at bedtime.    . chlorhexidine (PERIDEX) 0.12 % solution 15 mLs by Mouth Rinse route 2 (two) times daily.    . clobetasol ointment (TEMOVATE) 7.40 % Apply 1 application topically daily as needed (psoriasis).     Marland Kitchen digoxin (LANOXIN) 0.125 MG tablet Take by mouth.    . empagliflozin (JARDIANCE) 10 MG TABS tablet Jardiance 10 mg tablet  TAKE 1 TABLET BY MOUTH DAILY WITH BREAKFAST    . fenofibrate (TRICOR) 145 MG tablet Take 145 mg by mouth at bedtime.     . Guselkumab (TREMFYA) 100 MG/ML  SOPN Inject 100 mg into the skin every 8 (eight) weeks.     . lansoprazole (PREVACID) 30 MG capsule Take 30 mg by mouth every morning.     Marland Kitchen levothyroxine (SYNTHROID, LEVOTHROID) 50 MCG tablet Take 50 mcg by mouth daily before breakfast.     . linagliptin (TRADJENTA) 5 MG TABS tablet Take 5 mg by mouth daily.     Marland Kitchen losartan (COZAAR) 25 MG tablet Take 25 mg by mouth daily.    . Magnesium 400 MG CAPS Take 400 mg by mouth 2 (two) times daily.    . meloxicam (MOBIC) 15 MG tablet Take 15 mg by mouth daily.     . metolazone (ZAROXOLYN) 2.5 MG tablet Take 2.5 mg by mouth daily.    . metoprolol succinate (TOPROL-XL) 25 MG 24 hr tablet Take 50 mg by mouth daily. Take with or immediately following a meal.     . naloxone (NARCAN) nasal spray 4 mg/0.1 mL Place 1 spray into the nose as needed (opioid overdose).     Marland Kitchen sitaGLIPtin-metformin (JANUMET) 50-1000 MG tablet Take 1 tablet by mouth 2 (two) times daily with a meal.     . spironolactone (ALDACTONE) 25 MG tablet Take 25 mg by mouth daily.    Marland Kitchen torsemide (DEMADEX) 20 MG tablet Take 60-80 mg by mouth See admin instructions. Alternate between 60mg  and 80mg  every other day.    . valACYclovir (VALTREX) 500 MG  tablet Take 2,000 mg by mouth See admin instructions. Take 4 tablets (2,000mg ) by mouth as directed at onset of cold sore.    . venlafaxine XR (EFFEXOR-XR) 75 MG 24 hr capsule Take 75 mg by mouth daily.   1   Facility-Administered Medications Prior to Visit  Medication Dose Route Frequency Provider Last Rate Last Admin  . ropivacaine (PF) 2 mg/mL (0.2%) (NAROPIN) injection 1 mL  1 mL Epidural Once Molli Barrows, MD        Review of Systems CNS: No confusion or sedation Cardiac: No angina or palpitations GI: No abdominal pain or constipation Constitutional: No nausea vomiting fevers or chills  Objective:  BP 99/76 (BP Location: Right Arm, Patient Position: Sitting, Cuff Size: Normal)   Pulse 92   Temp 98.4 F (36.9 C) (Temporal)   Resp 16    Ht 5\' 10"  (1.778 m)   Wt 250 lb (113.4 kg)   SpO2 98%   BMI 35.87 kg/m    BP Readings from Last 3 Encounters:  06/11/20 99/76  05/05/20 104/64  03/13/19 98/76     Wt Readings from Last 3 Encounters:  06/11/20 250 lb (113.4 kg)  05/05/20 256 lb (116.1 kg)  03/13/19 260 lb (117.9 kg)     Physical Exam Pt is alert and oriented PERRL EOMI HEART IS RRR no murmur or rub LCTA no wheezing or rales MUSCULOSKELETAL.  Reveals 2 trigger points in the mid body trapezius and in the splenius capitis as well as one in the lateral deltoid region.  These do reproduce his primary pain complaint.  His strength appears to be at baseline in the upper extremities.  Labs  No results found for: HGBA1C Lab Results  Component Value Date   CREATININE 2.08 (H) 02/13/2019    -------------------------------------------------------------------------------------------------------------------- Lab Results  Component Value Date   WBC 7.5 02/13/2019   HGB 11.8 (L) 02/13/2019   HCT 37.2 (L) 02/13/2019   PLT 316 02/13/2019   GLUCOSE 142 (H) 02/13/2019   ALT 12 10/06/2013   AST 17 10/06/2013   NA 139 02/13/2019   K 4.3 02/13/2019   CL 102 02/13/2019   CREATININE 2.08 (H) 02/13/2019   BUN 26 (H) 02/13/2019   CO2 25 02/13/2019   INR 1.24 02/18/2019    --------------------------------------------------------------------------------------------------------------------- No results found.   Assessment & Plan:   There are no diagnoses linked to this encounter.      ----------------------------------------------------------------------------------------------------------------------  Problem List Items Addressed This Visit    None     1. Chronic pain in left shoulder   2. Chronic pain syndrome   3. Chronic, continuous use of opioids   4. Cervicalgia   5. Bilateral hand pain   6. Neuralgic facial pain   7. Chronic musculoskeletal pain   8. Pain in joint of left shoulder   9. Chronic  bilateral low back pain without sciatica   Based on our discussion today I am going to refill his medications be dated for June 17 and July 17.  I am going to increase him to 2 tablets 3 times daily or he can take these 1 tablet every 4 hours.  I have reviewed the [facility] practitioner database information and it is appropriate.  Furthermore I am going to perform trigger points injected to the after mentioned trigger points.  We gone over the risks and benefits of the procedure in full detail and all questions answered.  I want him to continue stretching strengthening exercises as reviewed with him  today with return to clinic in 2 months.   ----------------------------------------------------------------------------------------------------------------------  There are no diagnoses linked to this encounter.   ----------------------------------------------------------------------------------------------------------------------  I am having Adam Andrade maintain his Tradjenta, fenofibrate, lansoprazole, clobetasol ointment, sitaGLIPtin-metformin, naloxone, venlafaxine XR, apixaban, losartan, torsemide, Guselkumab, meloxicam, amiodarone, atorvastatin, levothyroxine, Magnesium, metoprolol succinate, chlorhexidine, valACYclovir, spironolactone, metolazone, digoxin, Jardiance, Oxycodone HCl, and Oxycodone HCl. We will continue to administer ropivacaine (PF) 2 mg/mL (0.2%).   Meds ordered this encounter  Medications  . DISCONTD: Oxycodone HCl 10 MG TABS    Sig: Take 1 tablet (10 mg total) by mouth every 4 (four) hours as needed.    Dispense:  180 tablet    Refill:  0  . DISCONTD: Oxycodone HCl 10 MG TABS    Sig: Take 1 tablet (10 mg total) by mouth every 4 (four) hours as needed.    Dispense:  180 tablet    Refill:  0  . dexamethasone (DECADRON) injection 10 mg  . ropivacaine (PF) 2 mg/mL (0.2%) (NAROPIN) injection 10 mL  . Oxycodone HCl 10 MG TABS    Sig: Take 1 tablet (10 mg total) by  mouth every 4 (four) hours as needed.    Dispense:  180 tablet    Refill:  0  . Oxycodone HCl 10 MG TABS    Sig: Take 1 tablet (10 mg total) by mouth every 4 (four) hours as needed.    Dispense:  180 tablet    Refill:  0   Patient's Medications  New Prescriptions   OXYCODONE HCL 10 MG TABS    Take 1 tablet (10 mg total) by mouth every 4 (four) hours as needed.   OXYCODONE HCL 10 MG TABS    Take 1 tablet (10 mg total) by mouth every 4 (four) hours as needed.  Previous Medications   AMIODARONE (PACERONE) 200 MG TABLET    Take 200 mg by mouth daily.    APIXABAN (ELIQUIS) 5 MG TABS TABLET    Take 5 mg by mouth 2 (two) times daily.    ATORVASTATIN (LIPITOR) 40 MG TABLET    Take 40 mg by mouth at bedtime.   CHLORHEXIDINE (PERIDEX) 0.12 % SOLUTION    15 mLs by Mouth Rinse route 2 (two) times daily.   CLOBETASOL OINTMENT (TEMOVATE) 0.05 %    Apply 1 application topically daily as needed (psoriasis).    DIGOXIN (LANOXIN) 0.125 MG TABLET    Take by mouth.   EMPAGLIFLOZIN (JARDIANCE) 10 MG TABS TABLET    Jardiance 10 mg tablet  TAKE 1 TABLET BY MOUTH DAILY WITH BREAKFAST   FENOFIBRATE (TRICOR) 145 MG TABLET    Take 145 mg by mouth at bedtime.    GUSELKUMAB (TREMFYA) 100 MG/ML SOPN    Inject 100 mg into the skin every 8 (eight) weeks.    LANSOPRAZOLE (PREVACID) 30 MG CAPSULE    Take 30 mg by mouth every morning.    LEVOTHYROXINE (SYNTHROID, LEVOTHROID) 50 MCG TABLET    Take 50 mcg by mouth daily before breakfast.    LINAGLIPTIN (TRADJENTA) 5 MG TABS TABLET    Take 5 mg by mouth daily.    LOSARTAN (COZAAR) 25 MG TABLET    Take 25 mg by mouth daily.   MAGNESIUM 400 MG CAPS    Take 400 mg by mouth 2 (two) times daily.   MELOXICAM (MOBIC) 15 MG TABLET    Take 15 mg by mouth daily.    METOLAZONE (ZAROXOLYN) 2.5 MG TABLET    Take  2.5 mg by mouth daily.   METOPROLOL SUCCINATE (TOPROL-XL) 25 MG 24 HR TABLET    Take 50 mg by mouth daily. Take with or immediately following a meal.    NALOXONE (NARCAN) NASAL  SPRAY 4 MG/0.1 ML    Place 1 spray into the nose as needed (opioid overdose).    SITAGLIPTIN-METFORMIN (JANUMET) 50-1000 MG TABLET    Take 1 tablet by mouth 2 (two) times daily with a meal.    SPIRONOLACTONE (ALDACTONE) 25 MG TABLET    Take 25 mg by mouth daily.   TORSEMIDE (DEMADEX) 20 MG TABLET    Take 60-80 mg by mouth See admin instructions. Alternate between 60mg  and 80mg  every other day.   VALACYCLOVIR (VALTREX) 500 MG TABLET    Take 2,000 mg by mouth See admin instructions. Take 4 tablets (2,000mg ) by mouth as directed at onset of cold sore.   VENLAFAXINE XR (EFFEXOR-XR) 75 MG 24 HR CAPSULE    Take 75 mg by mouth daily.   Modified Medications   No medications on file  Discontinued Medications   No medications on file   ----------------------------------------------------------------------------------------------------------------------  Follow-up: Return in about 2 months (around 08/11/2020) for evaluation, med refill.  Trigger point injection: The area overlying the aforementioned trigger points were prepped with alcohol. They were then injected with a 25-gauge needle with 3 cc of ropivacaine 0.2% and Decadron 3 mg at each site after negative aspiration for heme. This was performed after informed consent was obtained and risks and benefits reviewed. She tolerated this procedure without difficulty and was convalesced and discharged to home in stable condition for follow-up as mentioned.  @Alexxander Kurt  Andree Elk, MD@  Molli Barrows, MD

## 2020-06-12 ENCOUNTER — Telehealth: Payer: Self-pay

## 2020-06-12 NOTE — Telephone Encounter (Signed)
Post procedure phone call.  LM 

## 2020-07-16 ENCOUNTER — Telehealth: Payer: Self-pay | Admitting: Anesthesiology

## 2020-07-16 NOTE — Telephone Encounter (Signed)
Patient went to pick up Oxycodone, could only get 141, needs script sent in for 49 to complete script. Please let patient know if and when this can be done. Thank you

## 2020-07-16 NOTE — Telephone Encounter (Signed)
Patient notified I sent a message to Dr. Andree Elk.

## 2020-07-20 MED ORDER — OXYCODONE HCL 10 MG PO TABS: 10.0000 mg | ORAL_TABLET | ORAL | 0 refills | Status: DC | PRN

## 2020-07-20 NOTE — Addendum Note (Signed)
Addended by: Molli Barrows on: 07/20/2020 04:01 PM   Modules accepted: Orders

## 2020-07-21 ENCOUNTER — Telehealth: Payer: Self-pay | Admitting: *Deleted

## 2020-07-21 NOTE — Telephone Encounter (Signed)
Patient notified script has been sent to pharmacy.

## 2020-07-21 NOTE — Telephone Encounter (Signed)
-----   Message from Molli Barrows, MD sent at 07/20/2020  4:01 PM EDT ----- Done on [id number] 05 tabs ----- Message ----- From: Landis Martins, RN Sent: 07/16/2020   1:21 PM EDT To: Molli Barrows, MD  Was only able to get 131 tabs from last script of Oxycodone. Needs 49 additional tabs to complete the script of #180.

## 2020-08-03 ENCOUNTER — Other Ambulatory Visit: Payer: Self-pay

## 2020-08-03 ENCOUNTER — Ambulatory Visit: Payer: BC Managed Care – PPO | Attending: Anesthesiology | Admitting: Anesthesiology

## 2020-08-04 ENCOUNTER — Ambulatory Visit: Payer: BC Managed Care – PPO | Attending: Anesthesiology | Admitting: Anesthesiology

## 2020-08-04 ENCOUNTER — Encounter: Payer: Self-pay | Admitting: Anesthesiology

## 2020-08-04 DIAGNOSIS — G518 Other disorders of facial nerve: Secondary | ICD-10-CM

## 2020-08-04 DIAGNOSIS — M1990 Unspecified osteoarthritis, unspecified site: Secondary | ICD-10-CM

## 2020-08-04 DIAGNOSIS — M545 Low back pain, unspecified: Secondary | ICD-10-CM

## 2020-08-04 DIAGNOSIS — F1190 Opioid use, unspecified, uncomplicated: Secondary | ICD-10-CM | POA: Diagnosis not present

## 2020-08-04 DIAGNOSIS — M542 Cervicalgia: Secondary | ICD-10-CM | POA: Diagnosis not present

## 2020-08-04 DIAGNOSIS — G894 Chronic pain syndrome: Secondary | ICD-10-CM | POA: Diagnosis not present

## 2020-08-04 DIAGNOSIS — M25512 Pain in left shoulder: Secondary | ICD-10-CM

## 2020-08-04 DIAGNOSIS — M7918 Myalgia, other site: Secondary | ICD-10-CM

## 2020-08-04 DIAGNOSIS — G8929 Other chronic pain: Secondary | ICD-10-CM

## 2020-08-04 DIAGNOSIS — M79641 Pain in right hand: Secondary | ICD-10-CM

## 2020-08-04 DIAGNOSIS — M79642 Pain in left hand: Secondary | ICD-10-CM

## 2020-08-04 HISTORY — DX: Unspecified osteoarthritis, unspecified site: M19.90

## 2020-08-04 MED ORDER — OXYCODONE HCL 10 MG PO TABS: 10.0000 mg | ORAL_TABLET | ORAL | 0 refills | Status: AC | PRN

## 2020-08-04 MED ORDER — OXYCODONE HCL 10 MG PO TABS: 10.0000 mg | ORAL_TABLET | Freq: Four times a day (QID) | ORAL | 0 refills | Status: DC

## 2020-08-04 NOTE — Progress Notes (Signed)
Virtual Visit via Telephone Note  I connected with Adam Andrade on 08/04/20 at 10:45 AM EDT by telephone and verified that I am speaking with the correct person using two identifiers.  Location: Patient: Home Provider: Pain control center   I discussed the limitations, risks, security and privacy concerns of performing an evaluation and management service by telephone and the availability of in person appointments. I also discussed with the patient that there may be a patient responsible charge related to this service. The patient expressed understanding and agreed to proceed.   History of Present Illness: I spoke with Adam Andrade via telephone today as he was unable to do the video portion of the virtual conference.  He reports that his pain control has been good with the increase to oxycodone 1 tablet every 4 hours as needed.  This regimen is working well for him with no side effects and good overall pain control.  The symptom quality characteristic and distribution of his complex are otherwise unchanged.  No new changes in upper extremity strength or function or lower extremity strength or function are noted.  Bowel bladder functions been good.  No side effects are reported with the medications.    Observations/Objective:  Current Outpatient Medications:  .  amiodarone (PACERONE) 200 MG tablet, Take 200 mg by mouth daily. , Disp: , Rfl:  .  apixaban (ELIQUIS) 5 MG TABS tablet, Take 5 mg by mouth 2 (two) times daily. , Disp: , Rfl:  .  atorvastatin (LIPITOR) 40 MG tablet, Take 40 mg by mouth at bedtime., Disp: , Rfl:  .  chlorhexidine (PERIDEX) 0.12 % solution, 15 mLs by Mouth Rinse route 2 (two) times daily., Disp: , Rfl:  .  clobetasol ointment (TEMOVATE) 8.09 %, Apply 1 application topically daily as needed (psoriasis). , Disp: , Rfl:  .  digoxin (LANOXIN) 0.125 MG tablet, Take by mouth., Disp: , Rfl:  .  empagliflozin (JARDIANCE) 10 MG TABS tablet, Jardiance 10 mg tablet  TAKE 1 TABLET  BY MOUTH DAILY WITH BREAKFAST, Disp: , Rfl:  .  fenofibrate (TRICOR) 145 MG tablet, Take 145 mg by mouth at bedtime. , Disp: , Rfl:  .  Guselkumab (TREMFYA) 100 MG/ML SOPN, Inject 100 mg into the skin every 8 (eight) weeks. , Disp: , Rfl:  .  lansoprazole (PREVACID) 30 MG capsule, Take 30 mg by mouth every morning. , Disp: , Rfl:  .  levothyroxine (SYNTHROID, LEVOTHROID) 50 MCG tablet, Take 50 mcg by mouth daily before breakfast. , Disp: , Rfl:  .  linagliptin (TRADJENTA) 5 MG TABS tablet, Take 5 mg by mouth daily. , Disp: , Rfl:  .  losartan (COZAAR) 25 MG tablet, Take 25 mg by mouth daily., Disp: , Rfl:  .  Magnesium 400 MG CAPS, Take 400 mg by mouth 2 (two) times daily., Disp: , Rfl:  .  meloxicam (MOBIC) 15 MG tablet, Take 15 mg by mouth daily. , Disp: , Rfl:  .  metolazone (ZAROXOLYN) 2.5 MG tablet, Take 2.5 mg by mouth daily., Disp: , Rfl:  .  metoprolol succinate (TOPROL-XL) 25 MG 24 hr tablet, Take 50 mg by mouth daily. Take with or immediately following a meal. , Disp: , Rfl:  .  naloxone (NARCAN) nasal spray 4 mg/0.1 mL, Place 1 spray into the nose as needed (opioid overdose). , Disp: , Rfl:  .  [START ON 08/11/2020] Oxycodone HCl 10 MG TABS, Take 1 tablet (10 mg total) by mouth every 4 (four) hours as  needed., Disp: 180 tablet, Rfl: 0 .  [START ON 09/10/2020] Oxycodone HCl 10 MG TABS, Take 1 tablet (10 mg total) by mouth 4 (four) times daily., Disp: 180 tablet, Rfl: 0 .  sitaGLIPtin-metformin (JANUMET) 50-1000 MG tablet, Take 1 tablet by mouth 2 (two) times daily with a meal. , Disp: , Rfl:  .  spironolactone (ALDACTONE) 25 MG tablet, Take 25 mg by mouth daily., Disp: , Rfl:  .  torsemide (DEMADEX) 20 MG tablet, Take 60-80 mg by mouth See admin instructions. Alternate between 60mg  and 80mg  every other day., Disp: , Rfl:  .  valACYclovir (VALTREX) 500 MG tablet, Take 2,000 mg by mouth See admin instructions. Take 4 tablets (2,000mg ) by mouth as directed at onset of cold sore., Disp: , Rfl:   .  venlafaxine XR (EFFEXOR-XR) 75 MG 24 hr capsule, Take 75 mg by mouth daily. , Disp: , Rfl: 1  Current Facility-Administered Medications:  .  ropivacaine (PF) 2 mg/mL (0.2%) (NAROPIN) injection 1 mL, 1 mL, Epidural, Once, [name], MD  Assessment and Plan:  1. Chronic pain in left shoulder   2. Chronic pain syndrome   3. Chronic, continuous use of opioids   4. Cervicalgia   5. Bilateral hand pain   6. Neuralgic facial pain   7. Pain in joint of left shoulder   8. Chronic bilateral low back pain without sciatica   9. Chronic musculoskeletal pain   Based on our discussion today and upon review of the [facility] practitioner database information I will refill his medications for the next 2 months.  Is to be made out for 1 tablet every 4 hours as needed as needed for pain.  I want him to continue with core stretching strengthening exercises as previously reviewed and continue follow-up with his primary care physicians.  I have reviewed his most recent urinary drug screen and he continues to be compliant with this regimen with no side effects reported.  He is scheduled for 21-month return to clinic. Follow Up Instructions:    I discussed the assessment and treatment plan with the patient. The patient was provided an opportunity to ask questions and all were answered. The patient agreed with the plan and demonstrated an understanding of the instructions.   The patient was advised to call back or seek an in-person evaluation if the symptoms worsen or if the condition fails to improve as anticipated.  I provided 30 minutes of non-face-to-face time during this encounter.   Molli Barrows, MD

## 2020-08-11 ENCOUNTER — Telehealth: Payer: BC Managed Care – PPO | Admitting: Anesthesiology

## 2020-09-14 ENCOUNTER — Telehealth: Payer: Self-pay | Admitting: *Deleted

## 2020-09-14 NOTE — Telephone Encounter (Signed)
Spoke with patient re; his short fill on oxycodone 10 mg, pharmacy did not have enough to complete Rx.  10 pills short of qty. Patient does have enough medication to last until appt on 09/25/20.  I told patient that Dr Andree Elk would have to address this at the next appt.  Patient is agreeable to this.

## 2020-09-25 ENCOUNTER — Ambulatory Visit: Payer: BC Managed Care – PPO | Attending: Anesthesiology | Admitting: Anesthesiology

## 2020-09-25 ENCOUNTER — Other Ambulatory Visit: Payer: Self-pay

## 2020-09-25 DIAGNOSIS — G894 Chronic pain syndrome: Secondary | ICD-10-CM | POA: Diagnosis not present

## 2020-09-25 DIAGNOSIS — M79642 Pain in left hand: Secondary | ICD-10-CM

## 2020-09-25 DIAGNOSIS — G518 Other disorders of facial nerve: Secondary | ICD-10-CM

## 2020-09-25 DIAGNOSIS — M79641 Pain in right hand: Secondary | ICD-10-CM

## 2020-09-25 DIAGNOSIS — G8929 Other chronic pain: Secondary | ICD-10-CM

## 2020-09-25 DIAGNOSIS — F1190 Opioid use, unspecified, uncomplicated: Secondary | ICD-10-CM | POA: Diagnosis not present

## 2020-09-25 DIAGNOSIS — M25512 Pain in left shoulder: Secondary | ICD-10-CM

## 2020-09-25 DIAGNOSIS — M542 Cervicalgia: Secondary | ICD-10-CM

## 2020-09-25 DIAGNOSIS — M545 Low back pain, unspecified: Secondary | ICD-10-CM

## 2020-09-25 DIAGNOSIS — M7918 Myalgia, other site: Secondary | ICD-10-CM

## 2020-09-27 MED ORDER — OXYCODONE HCL 10 MG PO TABS: 10.0000 mg | ORAL_TABLET | ORAL | 0 refills | Status: AC | PRN

## 2020-09-27 MED ORDER — OXYCODONE HCL 10 MG PO TABS: 10.0000 mg | ORAL_TABLET | ORAL | 0 refills | Status: DC | PRN

## 2020-09-27 NOTE — Progress Notes (Signed)
Virtual Visit via Telephone Note  I connected with Adam Andrade on 09/27/20 at  9:45 AM EDT by telephone and verified that I am speaking with the correct person using two identifiers.  Location: Patient: Home Provider: [facility]   I discussed the limitations, risks, security and privacy concerns of performing an evaluation and management service by telephone and the availability of in person appointments. I also discussed with the patient that there may be a patient responsible charge related to this service. The patient expressed understanding and agreed to proceed.   History of Present Illness: I spoke with [name] via telephone as he was unable to do the video portion of the virtual conference.  He reports that his low back pain facial pain and hand pain has been stable in nature.  No changes are reported at this time and the quality characteristic or distribution of his pain.  He is taking his oxycodone 10 mg tablets approximately every 4 hours and occasionally 2 tablets 3 times a day.  He is getting good relief with these and reports no side effects.  Based on our discussion today he continues to derive good functional lifestyle improvement with the medicines as compared to when he was not taking them.  He gets relief for approximately 3 to 5 hours with each dose.  No untoward side effects are noted.  Otherwise he is in his usual state of health with no changes in his health status as reported today.   Observations/Objective:   Current Outpatient Medications:  .  amiodarone (PACERONE) 200 MG tablet, Take 200 mg by mouth daily. , Disp: , Rfl:  .  apixaban (ELIQUIS) 5 MG TABS tablet, Take 5 mg by mouth 2 (two) times daily. , Disp: , Rfl:  .  atorvastatin (LIPITOR) 40 MG tablet, Take 40 mg by mouth at bedtime., Disp: , Rfl:  .  chlorhexidine (PERIDEX) 0.12 % solution, 15 mLs by Mouth Rinse route 2 (two) times daily., Disp: , Rfl:  .  clobetasol ointment (TEMOVATE) 5.72 %,  Apply 1 application topically daily as needed (psoriasis). , Disp: , Rfl:  .  digoxin (LANOXIN) 0.125 MG tablet, Take by mouth., Disp: , Rfl:  .  empagliflozin (JARDIANCE) 10 MG TABS tablet, Jardiance 10 mg tablet  TAKE 1 TABLET BY MOUTH DAILY WITH BREAKFAST, Disp: , Rfl:  .  fenofibrate (TRICOR) 145 MG tablet, Take 145 mg by mouth at bedtime. , Disp: , Rfl:  .  Guselkumab (TREMFYA) 100 MG/ML SOPN, Inject 100 mg into the skin every 8 (eight) weeks. , Disp: , Rfl:  .  lansoprazole (PREVACID) 30 MG capsule, Take 30 mg by mouth every morning. , Disp: , Rfl:  .  levothyroxine (SYNTHROID, LEVOTHROID) 50 MCG tablet, Take 50 mcg by mouth daily before breakfast. , Disp: , Rfl:  .  linagliptin (TRADJENTA) 5 MG TABS tablet, Take 5 mg by mouth daily. , Disp: , Rfl:  .  losartan (COZAAR) 25 MG tablet, Take 25 mg by mouth daily., Disp: , Rfl:  .  Magnesium 400 MG CAPS, Take 400 mg by mouth 2 (two) times daily., Disp: , Rfl:  .  meloxicam (MOBIC) 15 MG tablet, Take 15 mg by mouth daily. , Disp: , Rfl:  .  metolazone (ZAROXOLYN) 2.5 MG tablet, Take 2.5 mg by mouth daily., Disp: , Rfl:  .  metoprolol succinate (TOPROL-XL) 25 MG 24 hr tablet, Take 50 mg by mouth daily. Take with or immediately following a meal. ,  Disp: , Rfl:  .  naloxone (NARCAN) nasal spray 4 mg/0.1 mL, Place 1 spray into the nose as needed (opioid overdose). , Disp: , Rfl:  .  [START ON 10/09/2020] Oxycodone HCl 10 MG TABS, Take 1 tablet (10 mg total) by mouth every 4 (four) hours as needed., Disp: 180 tablet, Rfl: 0 .  [START ON 11/08/2020] Oxycodone HCl 10 MG TABS, Take 1 tablet (10 mg total) by mouth every 4 (four) hours as needed., Disp: 180 tablet, Rfl: 0 .  sitaGLIPtin-metformin (JANUMET) 50-1000 MG tablet, Take 1 tablet by mouth 2 (two) times daily with a meal. , Disp: , Rfl:  .  spironolactone (ALDACTONE) 25 MG tablet, Take 25 mg by mouth daily., Disp: , Rfl:  .  torsemide (DEMADEX) 20 MG tablet, Take 60-80 mg by mouth See admin  instructions. Alternate between 60mg  and 80mg  every other day., Disp: , Rfl:  .  valACYclovir (VALTREX) 500 MG tablet, Take 2,000 mg by mouth See admin instructions. Take 4 tablets (2,000mg ) by mouth as directed at onset of cold sore., Disp: , Rfl:  .  venlafaxine XR (EFFEXOR-XR) 75 MG 24 hr capsule, Take 75 mg by mouth daily. , Disp: , Rfl: 1  Current Facility-Administered Medications:  .  ropivacaine (PF) 2 mg/mL (0.2%) (NAROPIN) injection 1 mL, 1 mL, Epidural, Once, [name], MD Assessment and Plan: 1. Chronic pain in left shoulder   2. Chronic pain syndrome   3. Chronic, continuous use of opioids   4. Cervicalgia   5. Bilateral hand pain   6. Neuralgic facial pain   7. Pain in joint of left shoulder   8. Chronic bilateral low back pain without sciatica   9. Chronic musculoskeletal pain   Based on discussion today and upon review of the [facility] practitioner database information we will make refills for his oxycodone for 1015 and 1114.  This should get him back on track.  We recently increased his dose to 2 tablets 3 times a day or 1 tablet every 4 hours and this regimen seems to be working well for him.  He denies any side effects.  He states that his pain syndrome is well controlled with this medication regimen and unfortunately he has failed more conservative therapy but this is presently working well for him.  We talked about the risks and benefits of chronic opioid management.  No other changes are initiated today I encouraged him to continue follow-up with his primary care physicians for his baseline medical care as well.  We will schedule him for a 77-month return  Follow Up Instructions:    I discussed the assessment and treatment plan with the patient. The patient was provided an opportunity to ask questions and all were answered. The patient agreed with the plan and demonstrated an understanding of the instructions.   The patient was advised to call back or seek an  in-person evaluation if the symptoms worsen or if the condition fails to improve as anticipated.  I provided 30 minutes of non-face-to-face time during this encounter.   Molli Barrows, MD

## 2020-11-30 ENCOUNTER — Ambulatory Visit
Admission: RE | Admit: 2020-11-30 | Discharge: 2020-11-30 | Disposition: A | Discharge status: Home / Self Care | Payer: BC Managed Care – PPO | Source: Ambulatory Visit | Attending: Anesthesiology | Admitting: Anesthesiology

## 2020-11-30 ENCOUNTER — Other Ambulatory Visit: Payer: Self-pay

## 2020-11-30 ENCOUNTER — Other Ambulatory Visit: Payer: Self-pay | Admitting: Anesthesiology

## 2020-11-30 ENCOUNTER — Encounter: Payer: Self-pay | Admitting: Anesthesiology

## 2020-11-30 ENCOUNTER — Ambulatory Visit (HOSPITAL_BASED_OUTPATIENT_CLINIC_OR_DEPARTMENT_OTHER): Payer: BC Managed Care – PPO | Admitting: Anesthesiology

## 2020-11-30 VITALS — HR 52 | Temp 97.2°F | Resp 16 | Ht 70.0 in | Wt 256.0 lb

## 2020-11-30 DIAGNOSIS — G518 Other disorders of facial nerve: Secondary | ICD-10-CM | POA: Diagnosis present

## 2020-11-30 DIAGNOSIS — G894 Chronic pain syndrome: Secondary | ICD-10-CM

## 2020-11-30 DIAGNOSIS — M79641 Pain in right hand: Secondary | ICD-10-CM

## 2020-11-30 DIAGNOSIS — M542 Cervicalgia: Secondary | ICD-10-CM | POA: Diagnosis present

## 2020-11-30 DIAGNOSIS — G8929 Other chronic pain: Secondary | ICD-10-CM | POA: Diagnosis not present

## 2020-11-30 DIAGNOSIS — R52 Pain, unspecified: Secondary | ICD-10-CM | POA: Diagnosis present

## 2020-11-30 DIAGNOSIS — M79642 Pain in left hand: Secondary | ICD-10-CM | POA: Diagnosis present

## 2020-11-30 DIAGNOSIS — F1190 Opioid use, unspecified, uncomplicated: Secondary | ICD-10-CM | POA: Insufficient documentation

## 2020-11-30 DIAGNOSIS — M25512 Pain in left shoulder: Secondary | ICD-10-CM | POA: Insufficient documentation

## 2020-11-30 MED ORDER — OXYCODONE HCL 10 MG PO TABS: 10.0000 mg | ORAL_TABLET | Freq: Four times a day (QID) | ORAL | 0 refills | Status: DC

## 2020-11-30 MED ORDER — DEXAMETHASONE SODIUM PHOSPHATE 10 MG/ML IJ SOLN
INTRAMUSCULAR | Status: AC
  Filled 2020-11-30: qty 1

## 2020-11-30 MED ORDER — ROPIVACAINE HCL 2 MG/ML IJ SOLN
10.0000 mL | Freq: Once | INTRAMUSCULAR | Status: AC
  Administered 2020-11-30: 10 mL via EPIDURAL

## 2020-11-30 MED ORDER — ROPIVACAINE HCL 2 MG/ML IJ SOLN
INTRAMUSCULAR | Status: AC
  Filled 2020-11-30: qty 10

## 2020-11-30 MED ORDER — DEXAMETHASONE SODIUM PHOSPHATE 10 MG/ML IJ SOLN
10.0000 mg | Freq: Once | INTRAMUSCULAR | Status: AC
  Administered 2020-11-30: 10 mg

## 2020-11-30 MED ORDER — OXYCODONE HCL 10 MG PO TABS: 10.0000 mg | ORAL_TABLET | ORAL | 0 refills | Status: AC | PRN

## 2020-11-30 NOTE — Progress Notes (Signed)
Subjective:  Patient ID: Adam Andrade, male    DOB: Jun 05, 1961  Age: 59 y.o. MRN: [id number]  CC: Shoulder Pain (left)   Procedure: Left trapezius trigger point x3  HPI Adam Andrade presents for reevaluation.  He continues to have baseline complaints of bilateral shoulder pain and worsening cervicalgia with left shoulder pain.  This is mainly in the left trapezius muscle and occasionally radiates into the left posterior neck and into the left lateral deltoid region.  This is been worse over the past month and he cannot get relief with conservative measures including efforts at stretching and heat and cold application.  In the past he reports having trigger points to this region which have abated most of his shoulder pain.  Despite this he continues to have diffuse osteoarthritic pain similar nature to what he has reported in the past.  No other changes in upper or lower extremity strength or function are noted.  His maxilla facial pain that has had historically has been stable in nature without change.  He is tolerating his oxycodone well and continues to derive good functional benefit from the medications he reports.  He generally averages 2 tablets 3 times a day without side effect.  Outpatient Medications Prior to Visit  Medication Sig Dispense Refill  . amiodarone (PACERONE) 200 MG tablet Take 200 mg by mouth daily.     Marland Kitchen atorvastatin (LIPITOR) 40 MG tablet Take 40 mg by mouth at bedtime.    . chlorhexidine (PERIDEX) 0.12 % solution 15 mLs by Mouth Rinse route 2 (two) times daily.    . clobetasol ointment (TEMOVATE) 1.95 % Apply 1 application topically daily as needed (psoriasis).     Marland Kitchen digoxin (LANOXIN) 0.125 MG tablet Take by mouth.    . empagliflozin (JARDIANCE) 10 MG TABS tablet Jardiance 10 mg tablet  TAKE 1 TABLET BY MOUTH DAILY WITH BREAKFAST    . fenofibrate (TRICOR) 145 MG tablet Take 145 mg by mouth at bedtime.     . Guselkumab (TREMFYA) 100 MG/ML SOPN Inject 100 mg into the  skin every 8 (eight) weeks.     . lansoprazole (PREVACID) 30 MG capsule Take 30 mg by mouth every morning.     Marland Kitchen levothyroxine (SYNTHROID, LEVOTHROID) 50 MCG tablet Take 50 mcg by mouth daily before breakfast.     . linagliptin (TRADJENTA) 5 MG TABS tablet Take 5 mg by mouth daily.     Marland Kitchen losartan (COZAAR) 25 MG tablet Take 25 mg by mouth daily.    . Magnesium 400 MG CAPS Take 400 mg by mouth 2 (two) times daily.    . meloxicam (MOBIC) 15 MG tablet Take 15 mg by mouth daily.     . metolazone (ZAROXOLYN) 2.5 MG tablet Take 2.5 mg by mouth daily.    . metoprolol succinate (TOPROL-XL) 25 MG 24 hr tablet Take 50 mg by mouth daily. Take with or immediately following a meal.     . naloxone (NARCAN) nasal spray 4 mg/0.1 mL Place 1 spray into the nose as needed (opioid overdose).     Marland Kitchen sitaGLIPtin-metformin (JANUMET) 50-1000 MG tablet Take 1 tablet by mouth 2 (two) times daily with a meal.     . spironolactone (ALDACTONE) 25 MG tablet Take 25 mg by mouth daily.    Marland Kitchen torsemide (DEMADEX) 20 MG tablet Take 60-80 mg by mouth See admin instructions. Alternate between 60mg  and 80mg  every other day.    . valACYclovir (VALTREX) 500 MG tablet Take 2,000 mg  by mouth See admin instructions. Take 4 tablets (2,000mg ) by mouth as directed at onset of cold sore.    . venlafaxine XR (EFFEXOR-XR) 75 MG 24 hr capsule Take 75 mg by mouth daily.   1  . Oxycodone HCl 10 MG TABS Take 1 tablet (10 mg total) by mouth every 4 (four) hours as needed. 180 tablet 0  . apixaban (ELIQUIS) 5 MG TABS tablet Take 5 mg by mouth 2 (two) times daily.     . metFORMIN (GLUCOPHAGE) 1000 MG tablet Take 1,000 mg by mouth 2 (two) times daily.     Facility-Administered Medications Prior to Visit  Medication Dose Route Frequency Provider Last Rate Last Admin  . ropivacaine (PF) 2 mg/mL (0.2%) (NAROPIN) injection 1 mL  1 mL Epidural Once Molli Barrows, MD        Review of Systems CNS: No confusion or sedation Cardiac: No angina or  palpitations GI: No abdominal pain or constipation Constitutional: No nausea vomiting fevers or chills  Objective:  Pulse (!) 52   Temp (!) 97.2 F (36.2 C) (Temporal)   Resp 16   Ht 5\' 10"  (1.778 m)   Wt 256 lb (116.1 kg)   SpO2 99%   BMI 36.73 kg/m    BP Readings from Last 3 Encounters:  06/11/20 99/76  05/05/20 104/64  03/13/19 98/76     Wt Readings from Last 3 Encounters:  11/30/20 256 lb (116.1 kg)  06/11/20 250 lb (113.4 kg)  05/05/20 256 lb (116.1 kg)     Physical Exam Pt is alert and oriented PERRL EOMI HEART IS RRR no murmur or rub LCTA no wheezing or rales MUSCULOSKELETAL reveals 3 trigger points in the left mid body trapezius.  None in the cervical region.  Good range of motion at the atlantooccipital joint.  Good strength in the upper extremities.  Good range of motion at the glenohumeral joints.  Labs  No results found for: HGBA1C Lab Results  Component Value Date   CREATININE 2.08 (H) 02/13/2019    -------------------------------------------------------------------------------------------------------------------- Lab Results  Component Value Date   WBC 7.5 02/13/2019   HGB 11.8 (L) 02/13/2019   HCT 37.2 (L) 02/13/2019   PLT 316 02/13/2019   GLUCOSE 142 (H) 02/13/2019   ALT 12 10/06/2013   AST 17 10/06/2013   NA 139 02/13/2019   K 4.3 02/13/2019   CL 102 02/13/2019   CREATININE 2.08 (H) 02/13/2019   BUN 26 (H) 02/13/2019   CO2 25 02/13/2019   INR 1.24 02/18/2019    --------------------------------------------------------------------------------------------------------------------- No results found.   Assessment & Plan:   Adam Andrade was seen today for shoulder pain.  Diagnoses and all orders for this visit:  Chronic pain in left shoulder  Chronic pain syndrome  Chronic, continuous use of opioids  Cervicalgia  Bilateral hand pain  Pain in joint of left shoulder  Neuralgic facial pain  Other orders -     Oxycodone HCl 10 MG  TABS; Take 1 tablet (10 mg total) by mouth every 4 (four) hours as needed. -     Oxycodone HCl 10 MG TABS; Take 1 tablet (10 mg total) by mouth in the morning, at noon, in the evening, and at bedtime. -     dexamethasone (DECADRON) injection 10 mg -     ropivacaine (PF) 2 mg/mL (0.2%) (NAROPIN) injection 10 mL        ----------------------------------------------------------------------------------------------------------------------  Problem List Items Addressed This Visit      Unprioritized   Chronic pain  in left shoulder - Primary   Relevant Medications   Oxycodone HCl 10 MG TABS (Start on 12/08/2020)   Oxycodone HCl 10 MG TABS (Start on 01/07/2021)   Chronic pain syndrome   Relevant Medications   Oxycodone HCl 10 MG TABS (Start on 12/08/2020)   Oxycodone HCl 10 MG TABS (Start on 01/07/2021)    Other Visit Diagnoses    Chronic, continuous use of opioids       Cervicalgia       Bilateral hand pain       Pain in joint of left shoulder       Neuralgic facial pain            ----------------------------------------------------------------------------------------------------------------------  1. Chronic pain in left shoulder We will proceed with trigger point injections today.  Of gone over the risks and benefits with him in full detail and all questions are answered.  We will have him return to clinic in 2 months for medication refill and is instructed to contact us prior to that if he should have any problems or require additional counseling.  2. Chronic pain syndrome I have reviewed the [facility] practitioner database information and it is appropriate for refills.  These will be dated for December 14 and January 13  3. Chronic, continuous use of opioids As above  4. Cervicalgia Continue stretching strengthening exercises as reviewed today  5. Bilateral hand pain   6. Pain in joint of left shoulder   7. Neuralgic facial  pain     ----------------------------------------------------------------------------------------------------------------------  I am having [name] start on Oxycodone HCl. I am also having him maintain his Tradjenta, fenofibrate, lansoprazole, clobetasol ointment, sitaGLIPtin-metformin, naloxone, venlafaxine XR, apixaban, losartan, torsemide, Guselkumab, meloxicam, amiodarone, atorvastatin, levothyroxine, Magnesium, metoprolol succinate, chlorhexidine, valACYclovir, spironolactone, metolazone, digoxin, Jardiance, metFORMIN, and Oxycodone HCl. We administered dexamethasone and ropivacaine (PF) 2 mg/mL (0.2%). We will continue to administer ropivacaine (PF) 2 mg/mL (0.2%).   Meds ordered this encounter  Medications  . Oxycodone HCl 10 MG TABS    Sig: Take 1 tablet (10 mg total) by mouth every 4 (four) hours as needed.    Dispense:  180 tablet    Refill:  0  . Oxycodone HCl 10 MG TABS    Sig: Take 1 tablet (10 mg total) by mouth in the morning, at noon, in the evening, and at bedtime.    Dispense:  180 tablet    Refill:  0  . dexamethasone (DECADRON) injection 10 mg  . ropivacaine (PF) 2 mg/mL (0.2%) (NAROPIN) injection 10 mL   Patient's Medications  New Prescriptions   OXYCODONE HCL 10 MG TABS    Take 1 tablet (10 mg total) by mouth in the morning, at noon, in the evening, and at bedtime.  Previous Medications   AMIODARONE (PACERONE) 200 MG TABLET    Take 200 mg by mouth daily.    APIXABAN (ELIQUIS) 5 MG TABS TABLET    Take 5 mg by mouth 2 (two) times daily.    ATORVASTATIN (LIPITOR) 40 MG TABLET    Take 40 mg by mouth at bedtime.   CHLORHEXIDINE (PERIDEX) 0.12 % SOLUTION    15 mLs by Mouth Rinse route 2 (two) times daily.   CLOBETASOL OINTMENT (TEMOVATE) 0.05 %    Apply 1 application topically daily as needed (psoriasis).    DIGOXIN (LANOXIN) 0.125 MG TABLET    Take by mouth.   EMPAGLIFLOZIN (JARDIANCE) 10 MG TABS TABLET    Jardiance 10 mg tablet  TAKE  1 TABLET BY MOUTH  DAILY WITH BREAKFAST   FENOFIBRATE (TRICOR) 145 MG TABLET    Take 145 mg by mouth at bedtime.    GUSELKUMAB (TREMFYA) 100 MG/ML SOPN    Inject 100 mg into the skin every 8 (eight) weeks.    LANSOPRAZOLE (PREVACID) 30 MG CAPSULE    Take 30 mg by mouth every morning.    LEVOTHYROXINE (SYNTHROID, LEVOTHROID) 50 MCG TABLET    Take 50 mcg by mouth daily before breakfast.    LINAGLIPTIN (TRADJENTA) 5 MG TABS TABLET    Take 5 mg by mouth daily.    LOSARTAN (COZAAR) 25 MG TABLET    Take 25 mg by mouth daily.   MAGNESIUM 400 MG CAPS    Take 400 mg by mouth 2 (two) times daily.   MELOXICAM (MOBIC) 15 MG TABLET    Take 15 mg by mouth daily.    METFORMIN (GLUCOPHAGE) 1000 MG TABLET    Take 1,000 mg by mouth 2 (two) times daily.   METOLAZONE (ZAROXOLYN) 2.5 MG TABLET    Take 2.5 mg by mouth daily.   METOPROLOL SUCCINATE (TOPROL-XL) 25 MG 24 HR TABLET    Take 50 mg by mouth daily. Take with or immediately following a meal.    NALOXONE (NARCAN) NASAL SPRAY 4 MG/0.1 ML    Place 1 spray into the nose as needed (opioid overdose).    SITAGLIPTIN-METFORMIN (JANUMET) 50-1000 MG TABLET    Take 1 tablet by mouth 2 (two) times daily with a meal.    SPIRONOLACTONE (ALDACTONE) 25 MG TABLET    Take 25 mg by mouth daily.   TORSEMIDE (DEMADEX) 20 MG TABLET    Take 60-80 mg by mouth See admin instructions. Alternate between 60mg  and 80mg  every other day.   VALACYCLOVIR (VALTREX) 500 MG TABLET    Take 2,000 mg by mouth See admin instructions. Take 4 tablets (2,000mg ) by mouth as directed at onset of cold sore.   VENLAFAXINE XR (EFFEXOR-XR) 75 MG 24 HR CAPSULE    Take 75 mg by mouth daily.   Modified Medications   Modified Medication Previous Medication   OXYCODONE HCL 10 MG TABS Oxycodone HCl 10 MG TABS      Take 1 tablet (10 mg total) by mouth every 4 (four) hours as needed.    Take 1 tablet (10 mg total) by mouth every 4 (four) hours as needed.  Discontinued Medications   No medications on file    ----------------------------------------------------------------------------------------------------------------------  Follow-up: No follow-ups on file.  Trigger point injection: The area overlying the aforementioned trigger points were prepped with alcohol. They were then injected with a 25-gauge needle with 3 cc of ropivacaine 0.2% and Decadron3 mg at each site after negative aspiration for heme. This was performed after informed consent was obtained and risks and benefits reviewed. She tolerated this procedure without difficulty and was convalesced and discharged to home in stable condition for follow-up as mentioned.  @Marlin Jarrard  Andree Elk, MD@  Molli Barrows, MD

## 2020-11-30 NOTE — Progress Notes (Signed)
Nursing Pain Medication Assessment:  Safety precautions to be maintained throughout the outpatient stay will include: orient to surroundings, keep bed in low position, maintain call bell within reach at all times, provide assistance with transfer out of bed and ambulation.  Medication Inspection Compliance: Pill count conducted under aseptic conditions, in front of the patient. Neither the pills nor the bottle was removed from the patient's sight at any time. Once count was completed pills were immediately returned to the patient in their original bottle.  Medication: Oxycodone IR Pill/Patch Count: 40 of 180 pills remain Pill/Patch Appearance: Markings consistent with prescribed medication Bottle Appearance: Standard pharmacy container. Clearly labeled. Filled Date: 11/08/2020 Last Medication intake:  Today

## 2020-11-30 NOTE — Patient Instructions (Signed)

## 2020-12-15 ENCOUNTER — Other Ambulatory Visit: Payer: Self-pay | Admitting: Physician Assistant

## 2021-01-07 ENCOUNTER — Telehealth: Payer: Self-pay

## 2021-01-07 NOTE — Telephone Encounter (Signed)
I called [facility], they only gave him 100 tabs because that was all they had in stock. The directions are written incorrectly.  Should be for every 4 hours, not 4 times per day. Bubble message sent to Dr. Andree Elk, asking him to send a new script for 80 tabs.

## 2021-01-07 NOTE — Telephone Encounter (Signed)
Patient states his pain scripts are wrong / should be 6 times a day and 180 quanity. Please check this and call the patient

## 2021-01-12 ENCOUNTER — Telehealth: Payer: Self-pay | Admitting: *Deleted

## 2021-01-12 NOTE — Telephone Encounter (Signed)
Received refill request for medication- called to inform patient needs office visit and labs - last office visit greater than one year.  Office visit scheduled.

## 2021-01-21 ENCOUNTER — Telehealth: Payer: Self-pay | Admitting: Anesthesiology

## 2021-01-21 MED ORDER — OXYCODONE HCL 10 MG PO TABS: 10.0000 mg | ORAL_TABLET | ORAL | 0 refills | Status: DC

## 2021-01-21 NOTE — Telephone Encounter (Signed)
Still unable to get medications, please call patient

## 2021-01-21 NOTE — Addendum Note (Signed)
Addended by: Molli Barrows on: 01/21/2021 03:25 PM   Modules accepted: Orders

## 2021-01-21 NOTE — Telephone Encounter (Signed)
Called Dr. Andree Elk again today. States he will take care of it today. Patient called and informed.

## 2021-02-04 ENCOUNTER — Other Ambulatory Visit: Payer: Self-pay

## 2021-02-04 ENCOUNTER — Ambulatory Visit: Payer: BC Managed Care – PPO | Attending: Anesthesiology | Admitting: Anesthesiology

## 2021-02-04 DIAGNOSIS — M79641 Pain in right hand: Secondary | ICD-10-CM

## 2021-02-04 DIAGNOSIS — G518 Other disorders of facial nerve: Secondary | ICD-10-CM

## 2021-02-04 DIAGNOSIS — M542 Cervicalgia: Secondary | ICD-10-CM

## 2021-02-04 DIAGNOSIS — F1190 Opioid use, unspecified, uncomplicated: Secondary | ICD-10-CM

## 2021-02-04 DIAGNOSIS — M545 Low back pain, unspecified: Secondary | ICD-10-CM

## 2021-02-04 DIAGNOSIS — G8929 Other chronic pain: Secondary | ICD-10-CM

## 2021-02-04 DIAGNOSIS — M25512 Pain in left shoulder: Secondary | ICD-10-CM

## 2021-02-04 DIAGNOSIS — M7918 Myalgia, other site: Secondary | ICD-10-CM

## 2021-02-04 DIAGNOSIS — G894 Chronic pain syndrome: Secondary | ICD-10-CM | POA: Diagnosis not present

## 2021-02-04 DIAGNOSIS — M79642 Pain in left hand: Secondary | ICD-10-CM

## 2021-02-05 ENCOUNTER — Encounter: Payer: Self-pay | Admitting: Anesthesiology

## 2021-02-05 MED ORDER — OXYCODONE HCL 10 MG PO TABS: 10.0000 mg | ORAL_TABLET | ORAL | 0 refills | Status: DC

## 2021-02-05 MED ORDER — OXYCODONE HCL 10 MG PO TABS: 10.0000 mg | ORAL_TABLET | ORAL | 0 refills | Status: AC

## 2021-02-05 NOTE — Progress Notes (Signed)
Virtual Visit via Telephone Note  I connected with Adam Andrade on 02/05/21 at  1:55 PM EST by telephone and verified that I am speaking with the correct person using two identifiers.  Location: Patient: Home Provider: Pain control center   I discussed the limitations, risks, security and privacy concerns of performing an evaluation and management service by telephone and the availability of in person appointments. I also discussed with the patient that there may be a patient responsible charge related to this service. The patient expressed understanding and agreed to proceed.   History of Present Illness:   I spoke with Adam Andrade via telephone as he was unable to do the video portion of the virtual conference but he reports that he is doing well in regards to his pain management with his current regimen.  He is taking his medications approximately every 4 hours of the 10 mg oxycodone strength and this is working well for him.  He denies any diverting or illicit use and mentions that he gets approximately 75 to 80% relief lasting for about 3 to 4 hours before he requires a repeat tablet.  Unfortunately has failed more conservative therapy and has been on chronic opioids for a considerable amount of time and this current regimen has worked well for him and kept him functional.  It allows him to stay active to continue to walk and exercise as best he can for diffuse body pain and wrist and shoulder pain in addition to some postradiation facial pain.  Otherwise is in his usual state of health at this time. Observations/Objective:  Current Outpatient Medications:  .  [START ON 03/07/2021] Oxycodone HCl 10 MG TABS, Take 1 tablet (10 mg total) by mouth every 4 (four) hours., Disp: 180 tablet, Rfl: 0 .  amiodarone (PACERONE) 200 MG tablet, Take 200 mg by mouth daily. , Disp: , Rfl:  .  apixaban (ELIQUIS) 5 MG TABS tablet, Take 5 mg by mouth 2 (two) times daily. , Disp: , Rfl:  .  atorvastatin  (LIPITOR) 40 MG tablet, Take 40 mg by mouth at bedtime., Disp: , Rfl:  .  chlorhexidine (PERIDEX) 0.12 % solution, 15 mLs by Mouth Rinse route 2 (two) times daily., Disp: , Rfl:  .  clobetasol ointment (TEMOVATE) 1.61 %, Apply 1 application topically daily as needed (psoriasis). , Disp: , Rfl:  .  digoxin (LANOXIN) 0.125 MG tablet, Take by mouth., Disp: , Rfl:  .  empagliflozin (JARDIANCE) 10 MG TABS tablet, Jardiance 10 mg tablet  TAKE 1 TABLET BY MOUTH DAILY WITH BREAKFAST, Disp: , Rfl:  .  fenofibrate (TRICOR) 145 MG tablet, Take 145 mg by mouth at bedtime. , Disp: , Rfl:  .  Guselkumab (TREMFYA) 100 MG/ML SOPN, Inject 100 mg into the skin every 8 (eight) weeks. , Disp: , Rfl:  .  lansoprazole (PREVACID) 30 MG capsule, Take 30 mg by mouth every morning. , Disp: , Rfl:  .  levothyroxine (SYNTHROID, LEVOTHROID) 50 MCG tablet, Take 50 mcg by mouth daily before breakfast. , Disp: , Rfl:  .  linagliptin (TRADJENTA) 5 MG TABS tablet, Take 5 mg by mouth daily. , Disp: , Rfl:  .  losartan (COZAAR) 25 MG tablet, Take 25 mg by mouth daily., Disp: , Rfl:  .  Magnesium 400 MG CAPS, Take 400 mg by mouth 2 (two) times daily., Disp: , Rfl:  .  meloxicam (MOBIC) 15 MG tablet, Take 15 mg by mouth daily. , Disp: , Rfl:  .  metFORMIN (GLUCOPHAGE) 1000 MG tablet, Take 1,000 mg by mouth 2 (two) times daily., Disp: , Rfl:  .  metolazone (ZAROXOLYN) 2.5 MG tablet, Take 2.5 mg by mouth daily., Disp: , Rfl:  .  metoprolol succinate (TOPROL-XL) 25 MG 24 hr tablet, Take 50 mg by mouth daily. Take with or immediately following a meal. , Disp: , Rfl:  .  naloxone (NARCAN) nasal spray 4 mg/0.1 mL, Place 1 spray into the nose as needed (opioid overdose). , Disp: , Rfl:  .  Oxycodone HCl 10 MG TABS, Take 1 tablet (10 mg total) by mouth every 4 (four) hours., Disp: 180 tablet, Rfl: 0 .  sitaGLIPtin-metformin (JANUMET) 50-1000 MG tablet, Take 1 tablet by mouth 2 (two) times daily with a meal. , Disp: , Rfl:  .  spironolactone  (ALDACTONE) 25 MG tablet, Take 25 mg by mouth daily., Disp: , Rfl:  .  torsemide (DEMADEX) 20 MG tablet, Take 60-80 mg by mouth See admin instructions. Alternate between 60mg  and 80mg  every other day., Disp: , Rfl:  .  TREMFYA 100 MG/ML SOSY, INJECT 1 SYRINGE UNDER THE SKIN EVERY 8 WEEKS., Disp: 1 mL, Rfl: 2 .  valACYclovir (VALTREX) 500 MG tablet, Take 2,000 mg by mouth See admin instructions. Take 4 tablets (2,000mg ) by mouth as directed at onset of cold sore., Disp: , Rfl:  .  venlafaxine XR (EFFEXOR-XR) 75 MG 24 hr capsule, Take 75 mg by mouth daily. , Disp: , Rfl: 1  Current Facility-Administered Medications:  .  ropivacaine (PF) 2 mg/mL (0.2%) (NAROPIN) injection 1 mL, 1 mL, Epidural, Once, [name], MD  Assessment and Plan: 1. Chronic pain in left shoulder   2. Chronic pain syndrome   3. Chronic, continuous use of opioids   4. Cervicalgia   5. Bilateral hand pain   6. Pain in joint of left shoulder   7. Neuralgic facial pain   8. Chronic bilateral low back pain without sciatica   9. Chronic musculoskeletal pain   Based on our discussion today and upon review of the [facility] practitioner database information I am refilling his medications for the next 2 months dated for today and a month from today.  This will be for 10 mg tablets of the oxycodone to be taken every 4 hours.  Schedule is for return to clinic in 2 months have encouraged him to continue with exercise and activity as reviewed today and return to clinic as mentioned with continue follow-up with his primary care physicians for baseline medical care.  Follow Up Instructions:    I discussed the assessment and treatment plan with the patient. The patient was provided an opportunity to ask questions and all were answered. The patient agreed with the plan and demonstrated an understanding of the instructions.   The patient was advised to call back or seek an in-person evaluation if the symptoms worsen or if the  condition fails to improve as anticipated.  I provided 30 minutes of non-face-to-face time during this encounter.   Molli Barrows, MD

## 2021-02-16 ENCOUNTER — Telehealth: Payer: BC Managed Care – PPO | Admitting: Anesthesiology

## 2021-02-23 NOTE — Telephone Encounter (Signed)
Pa completed with [facility] approval #  [id number] also fax office notes to [phone]. Patient has a follow up 03/27/21

## 2021-02-23 NOTE — Telephone Encounter (Signed)
PA completed with

## 2021-03-01 NOTE — Telephone Encounter (Signed)
Received Prior Authorization- called [facility] [phone] to see what else they need.  Per [facility] needed Clinical note- informed that we faxed clinical notes from last office visit 01/14/2020- per representative Office note have to be within the year.  They are unable to use clinical notes from 01/14/2020- told patient has upcoming appointment in 03/27/2021- nothing else we can do until we faxed updated office notes from within the year.

## 2021-03-30 ENCOUNTER — Encounter: Payer: Self-pay | Admitting: Anesthesiology

## 2021-03-30 ENCOUNTER — Ambulatory Visit (INDEPENDENT_AMBULATORY_CARE_PROVIDER_SITE_OTHER): Payer: BC Managed Care – PPO | Admitting: Physician Assistant

## 2021-03-30 ENCOUNTER — Other Ambulatory Visit: Payer: Self-pay

## 2021-03-30 ENCOUNTER — Encounter: Payer: Self-pay | Admitting: Physician Assistant

## 2021-03-30 ENCOUNTER — Ambulatory Visit: Payer: BC Managed Care – PPO | Attending: Anesthesiology | Admitting: Anesthesiology

## 2021-03-30 DIAGNOSIS — L570 Actinic keratosis: Secondary | ICD-10-CM | POA: Diagnosis not present

## 2021-03-30 DIAGNOSIS — M79641 Pain in right hand: Secondary | ICD-10-CM

## 2021-03-30 DIAGNOSIS — Z79899 Other long term (current) drug therapy: Secondary | ICD-10-CM | POA: Diagnosis not present

## 2021-03-30 DIAGNOSIS — G894 Chronic pain syndrome: Secondary | ICD-10-CM | POA: Diagnosis not present

## 2021-03-30 DIAGNOSIS — G8929 Other chronic pain: Secondary | ICD-10-CM

## 2021-03-30 DIAGNOSIS — M25512 Pain in left shoulder: Secondary | ICD-10-CM | POA: Diagnosis not present

## 2021-03-30 DIAGNOSIS — M25511 Pain in right shoulder: Secondary | ICD-10-CM

## 2021-03-30 DIAGNOSIS — L409 Psoriasis, unspecified: Secondary | ICD-10-CM

## 2021-03-30 DIAGNOSIS — L400 Psoriasis vulgaris: Secondary | ICD-10-CM

## 2021-03-30 DIAGNOSIS — G518 Other disorders of facial nerve: Secondary | ICD-10-CM

## 2021-03-30 DIAGNOSIS — M79642 Pain in left hand: Secondary | ICD-10-CM

## 2021-03-30 DIAGNOSIS — F1190 Opioid use, unspecified, uncomplicated: Secondary | ICD-10-CM | POA: Diagnosis not present

## 2021-03-30 DIAGNOSIS — L4050 Arthropathic psoriasis, unspecified: Secondary | ICD-10-CM | POA: Diagnosis not present

## 2021-03-30 DIAGNOSIS — M542 Cervicalgia: Secondary | ICD-10-CM | POA: Diagnosis not present

## 2021-03-30 DIAGNOSIS — M25519 Pain in unspecified shoulder: Secondary | ICD-10-CM | POA: Insufficient documentation

## 2021-03-30 NOTE — Progress Notes (Signed)
   Follow-Up Visit   Subjective  Adam Andrade is a 60 y.o. male who presents for the following: Psoriasis Here for follow up psoriasis- flared up today on forehead and superior gluteal fold x 2 months. Patient has been off Tremfya needs office visit and labs. He was tolerating medication.  Patient states he is clear when on Tremfya.  Patient c/o joint pain flared especially his hands. He had surgery on his left wrist. Per record review his he has type 2 diabetes that is poorly controlled   The following portions of the chart were reviewed this encounter and updated as appropriate:  Tobacco  Allergies  Meds  Problems  Med Hx  Surg Hx  Fam Hx      Objective  Well appearing patient in no apparent distress; mood and affect are within normal limits.  All skin waist up examined.  Objective  Left Malar Cheek, Left Superior Helix, Right Mid Helix: Erythematous patches with gritty scale.  Objective  Head - Anterior (Face), arms, superior gluteal fold.: Well-marginated erythematous plaques with scale.   Objective  both hands: Swollen joints. No tenosynovitis   Assessment & Plan  Psoriasis  Other Related Procedures QuantiFERON-TB Gold Plus  Encounter for long-term (current) use of medications  Other Related Procedures QuantiFERON-TB Gold Plus  AK (actinic keratosis) (3) Left Superior Helix; Right Mid Helix; Left Malar Cheek  Destruction of lesion - Left Malar Cheek, Left Superior Helix, Right Mid Helix Complexity: simple   Destruction method: cryotherapy   Informed consent: discussed and consent obtained   Timeout:  patient name, date of birth, surgical site, and procedure verified Lesion destroyed using liquid nitrogen: Yes   Cryotherapy cycles:  3 Outcome: patient tolerated procedure well with no complications    Psoriasis vulgaris Head - Anterior (Face), arms, superior gluteal fold.  Rx: Tremfya once we received TB results  Psoriatic arthritis ([place]) both  hands  He is seeing a hand specialist. Should improve when he restarts Tremfya.    We discussed the risks of his treatment. He understands the risks and wants to continue the medication.  I, Xara Paulding, PA-C, have reviewed all documentation's for this visit.  The documentation on 03/30/21 for the exam, diagnosis, procedures and orders are all accurate and complete.

## 2021-03-30 NOTE — Progress Notes (Signed)
Virtual Visit via Telephone Note  I connected with Adam Andrade on 03/30/21 at  1:20 PM EDT by telephone and verified that I am speaking with the correct person using two identifiers.  Location: Patient: Home Provider: Pain control center   I discussed the limitations, risks, security and privacy concerns of performing an evaluation and management service by telephone and the availability of in person appointments. I also discussed with the patient that there may be a patient responsible charge related to this service. The patient expressed understanding and agreed to proceed.   History of Present Illness: I spoke with Adam Andrade via telephone as he was unable to link for the video portion of virtual conference.  He reports that he has been doing reasonably well with his low back.  Furthermore his facial pain and hand pain have been stable but he is having a significant amount of right posterior lateral shoulder pain.  He reports that in the past has had trigger point injections for this and these have been quite effective for him.  He generally gets 75 to 80% relief from that and that keeps his pain under good control and enables him to do his stretching exercises.  The oxycodone he takes proximate every 4 hours gets about 75% relief lasting about 3 to 4 hours to before he has recurrence of the same pain.  Unfortunately more conservative therapy has been ineffective for him and he has been on chronic opioids for considerable period of time with good success.  No diverting or illicit use is noted and he has been compliant with the regimen.  Otherwise at this point he is in his usual state of health.   Observations/Objective:  Current Outpatient Medications:  .  amiodarone (PACERONE) 200 MG tablet, Take 200 mg by mouth daily. , Disp: , Rfl:  .  apixaban (ELIQUIS) 5 MG TABS tablet, Take 5 mg by mouth 2 (two) times daily. , Disp: , Rfl:  .  atorvastatin (LIPITOR) 40 MG tablet, Take 40 mg by mouth at  bedtime., Disp: , Rfl:  .  chlorhexidine (PERIDEX) 0.12 % solution, 15 mLs by Mouth Rinse route 2 (two) times daily., Disp: , Rfl:  .  clobetasol ointment (TEMOVATE) 5.63 %, Apply 1 application topically daily as needed (psoriasis). , Disp: , Rfl:  .  digoxin (LANOXIN) 0.125 MG tablet, Take by mouth., Disp: , Rfl:  .  empagliflozin (JARDIANCE) 10 MG TABS tablet, Jardiance 10 mg tablet  TAKE 1 TABLET BY MOUTH DAILY WITH BREAKFAST, Disp: , Rfl:  .  fenofibrate (TRICOR) 145 MG tablet, Take 145 mg by mouth at bedtime. , Disp: , Rfl:  .  Guselkumab 100 MG/ML SOPN, Inject 100 mg into the skin every 8 (eight) weeks. , Disp: , Rfl:  .  lansoprazole (PREVACID) 30 MG capsule, Take 30 mg by mouth every morning. , Disp: , Rfl:  .  levothyroxine (SYNTHROID, LEVOTHROID) 50 MCG tablet, Take 50 mcg by mouth daily before breakfast. , Disp: , Rfl:  .  linagliptin (TRADJENTA) 5 MG TABS tablet, Take 5 mg by mouth daily. , Disp: , Rfl:  .  losartan (COZAAR) 25 MG tablet, Take 25 mg by mouth daily., Disp: , Rfl:  .  Magnesium 400 MG CAPS, Take 400 mg by mouth 2 (two) times daily., Disp: , Rfl:  .  meloxicam (MOBIC) 15 MG tablet, Take 15 mg by mouth daily. , Disp: , Rfl:  .  metFORMIN (GLUCOPHAGE) 1000 MG tablet, Take 1,000 mg by mouth  2 (two) times daily., Disp: , Rfl:  .  metolazone (ZAROXOLYN) 2.5 MG tablet, Take 2.5 mg by mouth daily., Disp: , Rfl:  .  metoprolol succinate (TOPROL-XL) 25 MG 24 hr tablet, Take 50 mg by mouth daily. Take with or immediately following a meal., Disp: , Rfl:  .  naloxone (NARCAN) nasal spray 4 mg/0.1 mL, Place 1 spray into the nose as needed (opioid overdose). , Disp: , Rfl:  .  Oxycodone HCl 10 MG TABS, Take 1 tablet (10 mg total) by mouth every 4 (four) hours., Disp: 180 tablet, Rfl: 0 .  sitaGLIPtin-metformin (JANUMET) 50-1000 MG tablet, Take 1 tablet by mouth 2 (two) times daily with a meal. , Disp: , Rfl:  .  spironolactone (ALDACTONE) 25 MG tablet, Take 25 mg by mouth daily., Disp:  , Rfl:  .  torsemide (DEMADEX) 20 MG tablet, Take 60-80 mg by mouth See admin instructions. Alternate between 60mg  and 80mg  every other day., Disp: , Rfl:  .  TREMFYA 100 MG/ML SOSY, INJECT 1 SYRINGE UNDER THE SKIN EVERY 8 WEEKS., Disp: 1 mL, Rfl: 2 .  valACYclovir (VALTREX) 500 MG tablet, Take 2,000 mg by mouth See admin instructions. Take 4 tablets (2,000mg ) by mouth as directed at onset of cold sore., Disp: , Rfl:  .  venlafaxine XR (EFFEXOR-XR) 75 MG 24 hr capsule, Take 75 mg by mouth daily. , Disp: , Rfl: 1  Current Facility-Administered Medications:  .  ropivacaine (PF) 2 mg/mL (0.2%) (NAROPIN) injection 1 mL, 1 mL, Epidural, Once, [name], MD  Assessment and Plan: 1. Chronic pain in left shoulder   2. Chronic pain syndrome   3. Chronic, continuous use of opioids   4. Cervicalgia   5. Bilateral hand pain   6. Pain in joint of left shoulder   7. Neuralgic facial pain   8. Pain in joint of right shoulder   Based on our discussion today and upon review of the [place] practitioner database information going to refill his medications for the next 2 months dated for April 12 and May 12.  No other changes will be made in his pain management protocol.  I will schedule him for a 10-day return to clinic for right lateral trigger point injection.  We talked about some stretching exercises to help with persistent shoulder pain.  Furthermore I will have him follow-up with his primary care physicians for his baseline medical care.  Follow Up Instructions:    I discussed the assessment and treatment plan with the patient. The patient was provided an opportunity to ask questions and all were answered. The patient agreed with the plan and demonstrated an understanding of the instructions.   The patient was advised to call back or seek an in-person evaluation if the symptoms worsen or if the condition fails to improve as anticipated.  I provided 30 minutes of non-face-to-face time during  this encounter.   Molli Barrows, MD

## 2021-04-01 LAB — QUANTIFERON-TB GOLD PLUS
Mitogen-NIL: >10 IU/mL
NIL: 0.02 IU/mL
QuantiFERON-TB Gold Plus: NEGATIVE
TB1-NIL: 0 IU/mL
TB2-NIL: 0 IU/mL

## 2021-04-01 MED ORDER — OXYCODONE HCL 10 MG PO TABS: 10.0000 mg | ORAL_TABLET | ORAL | 0 refills | Status: DC

## 2021-04-01 MED ORDER — OXYCODONE HCL 10 MG PO TABS: 10.0000 mg | ORAL_TABLET | ORAL | 0 refills | Status: AC

## 2021-04-05 ENCOUNTER — Telehealth: Payer: Self-pay

## 2021-04-05 ENCOUNTER — Telehealth: Payer: Self-pay | Admitting: *Deleted

## 2021-04-05 MED ORDER — TREMFYA 100 MG/ML ~~LOC~~ SOSY: PREFILLED_SYRINGE | SUBCUTANEOUS | 2 refills | Status: AC

## 2021-04-05 MED ORDER — GUSELKUMAB 100 MG/ML ~~LOC~~ SOAJ: 100.0000 mg | SUBCUTANEOUS | 3 refills | Status: DC

## 2021-04-05 NOTE — Telephone Encounter (Signed)
Pharmacy needed clarification on Tremfya prescription.

## 2021-04-05 NOTE — Telephone Encounter (Signed)
-----   Message from Warren Danes, [place] sent at 04/05/2021  8:01 AM EDT ----- Good to receive Tremfya

## 2021-04-08 ENCOUNTER — Ambulatory Visit: Payer: BC Managed Care – PPO | Admitting: Anesthesiology

## 2021-04-13 ENCOUNTER — Other Ambulatory Visit: Payer: Self-pay | Admitting: Physician Assistant

## 2021-04-13 ENCOUNTER — Telehealth: Payer: Self-pay | Admitting: *Deleted

## 2021-04-13 MED ORDER — GUSELKUMAB 100 MG/ML ~~LOC~~ SOAJ: 100.0000 mg | SUBCUTANEOUS | 4 refills | Status: DC

## 2021-04-13 NOTE — Telephone Encounter (Signed)
Okay to continue Tremfya per kelli sheffield- refill sent to [facility] specialty pharmacy.

## 2021-04-19 ENCOUNTER — Encounter: Payer: Self-pay | Admitting: Anesthesiology

## 2021-04-19 ENCOUNTER — Other Ambulatory Visit: Payer: Self-pay

## 2021-04-19 ENCOUNTER — Ambulatory Visit: Payer: BC Managed Care – PPO | Attending: Anesthesiology | Admitting: Anesthesiology

## 2021-04-19 VITALS — BP 122/84 | HR 88 | Temp 97.1°F | Resp 18 | Ht 70.0 in | Wt 252.0 lb

## 2021-04-19 DIAGNOSIS — M545 Low back pain, unspecified: Secondary | ICD-10-CM

## 2021-04-19 DIAGNOSIS — M79642 Pain in left hand: Secondary | ICD-10-CM | POA: Diagnosis present

## 2021-04-19 DIAGNOSIS — G894 Chronic pain syndrome: Secondary | ICD-10-CM

## 2021-04-19 DIAGNOSIS — F1190 Opioid use, unspecified, uncomplicated: Secondary | ICD-10-CM

## 2021-04-19 DIAGNOSIS — M25511 Pain in right shoulder: Secondary | ICD-10-CM

## 2021-04-19 DIAGNOSIS — M542 Cervicalgia: Secondary | ICD-10-CM

## 2021-04-19 DIAGNOSIS — M25512 Pain in left shoulder: Secondary | ICD-10-CM | POA: Diagnosis present

## 2021-04-19 DIAGNOSIS — M79641 Pain in right hand: Secondary | ICD-10-CM

## 2021-04-19 DIAGNOSIS — G8929 Other chronic pain: Secondary | ICD-10-CM | POA: Diagnosis present

## 2021-04-19 DIAGNOSIS — G518 Other disorders of facial nerve: Secondary | ICD-10-CM | POA: Diagnosis present

## 2021-04-19 MED ORDER — OXYCODONE HCL 10 MG PO TABS: 10.0000 mg | ORAL_TABLET | ORAL | 0 refills | Status: DC

## 2021-04-19 MED ORDER — ROPIVACAINE HCL 2 MG/ML IJ SOLN
INTRAMUSCULAR | Status: AC
  Filled 2021-04-19: qty 10

## 2021-04-19 MED ORDER — ROPIVACAINE HCL 2 MG/ML IJ SOLN: 10.0000 mL | Freq: Once | INTRAMUSCULAR | Status: DC

## 2021-04-19 MED ORDER — DEXAMETHASONE SODIUM PHOSPHATE 10 MG/ML IJ SOLN
10.0000 mg | Freq: Once | INTRAMUSCULAR | Status: AC
  Administered 2021-04-19: 10 mg

## 2021-04-19 MED ORDER — DEXAMETHASONE SODIUM PHOSPHATE 10 MG/ML IJ SOLN
INTRAMUSCULAR | Status: AC
  Filled 2021-04-19: qty 1

## 2021-04-19 NOTE — Patient Instructions (Signed)

## 2021-04-19 NOTE — Progress Notes (Signed)
Nursing Pain Medication Assessment:  Safety precautions to be maintained throughout the outpatient stay will include: orient to surroundings, keep bed in low position, maintain call bell within reach at all times, provide assistance with transfer out of bed and ambulation.  Medication Inspection Compliance: Pill count conducted under aseptic conditions, in front of the patient. Neither the pills nor the bottle was removed from the patient's sight at any time. Once count was completed pills were immediately returned to the patient in their original bottle.  Medication: Oxycodone IR Pill/Patch Count: 101 of 180 pills remain Pill/Patch Appearance: Markings consistent with prescribed medication Bottle Appearance: Standard pharmacy container. Clearly labeled. Filled Date:04 / 13 / 2022 Last Medication intake:  Today

## 2021-04-22 NOTE — Progress Notes (Signed)
Subjective:  Patient ID: Adam Andrade, male    DOB: [birth year]  Age: 60 y.o. MRN: [id number]  CC: Shoulder Pain (bilateral)   Procedure: Trigger point injection left lateral shoulder right lateral shoulder left lateral upper extremity and right lateral upper extremity  HPI Adam Andrade presents for reevaluation.  He was last seen few months ago and continues to have baseline pain comparable to what he is experienced in the past.  He still taking his medications at oxycodone 10 mg tablets to of these 3 times a day and this continues to work well for him.  He denies any diverting or illicit use and based on his narcotic assessment she continues to derive good functional benefit from his medications.  He is having significant musculoskeletal pain in the after mentioned areas and he is asked for trigger point injections today.  In the past he is got approximately 75 to 80% relief from these lasting several months at a time.  He has not had a trigger point injections for well over a year at this stage.  He has been doing his conservative therapy with stretching and strengthening with limited success.  He denies any change in the quality characteristic or distribution of his pain and is primarily in the facial region occasionally in the low back and the bilateral shoulders chronically.  Outpatient Medications Prior to Visit  Medication Sig Dispense Refill  . amiodarone (PACERONE) 200 MG tablet Take 200 mg by mouth daily.     Marland Kitchen apixaban (ELIQUIS) 5 MG TABS tablet Take 5 mg by mouth 2 (two) times daily.     Marland Kitchen atorvastatin (LIPITOR) 40 MG tablet Take 40 mg by mouth at bedtime.    . chlorhexidine (PERIDEX) 0.12 % solution 15 mLs by Mouth Rinse route 2 (two) times daily.    . clobetasol ointment (TEMOVATE) 7.03 % Apply 1 application topically daily as needed (psoriasis).     Marland Kitchen digoxin (LANOXIN) 0.125 MG tablet Take by mouth.    . empagliflozin (JARDIANCE) 10 MG TABS tablet Jardiance 10 mg tablet  TAKE  1 TABLET BY MOUTH DAILY WITH BREAKFAST    . fenofibrate (TRICOR) 145 MG tablet Take 145 mg by mouth at bedtime.     . Guselkumab (TREMFYA) 100 MG/ML SOSY INJECT 1 SYRINGE UNDER THE SKIN EVERY 8 WEEKS. 1 mL 2  . lansoprazole (PREVACID) 30 MG capsule Take 30 mg by mouth every morning.     Marland Kitchen levothyroxine (SYNTHROID, LEVOTHROID) 50 MCG tablet Take 50 mcg by mouth daily before breakfast.     . linagliptin (TRADJENTA) 5 MG TABS tablet Take 5 mg by mouth daily.     Marland Kitchen losartan (COZAAR) 25 MG tablet Take 25 mg by mouth daily.    . Magnesium 400 MG CAPS Take 400 mg by mouth 2 (two) times daily.    . meloxicam (MOBIC) 15 MG tablet Take 15 mg by mouth daily.     . metFORMIN (GLUCOPHAGE) 1000 MG tablet Take 1,000 mg by mouth 2 (two) times daily.    . metolazone (ZAROXOLYN) 2.5 MG tablet Take 2.5 mg by mouth daily.    . metoprolol succinate (TOPROL-XL) 25 MG 24 hr tablet Take 50 mg by mouth daily. Take with or immediately following a meal.    . naloxone (NARCAN) nasal spray 4 mg/0.1 mL Place 1 spray into the nose as needed (opioid overdose).     . Oxycodone HCl 10 MG TABS Take 1 tablet (10 mg total) by mouth every  4 (four) hours. 180 tablet 0  . sitaGLIPtin-metformin (JANUMET) 50-1000 MG tablet Take 1 tablet by mouth 2 (two) times daily with a meal.     . spironolactone (ALDACTONE) 25 MG tablet Take 25 mg by mouth daily.    Marland Kitchen torsemide (DEMADEX) 20 MG tablet Take 60-80 mg by mouth See admin instructions. Alternate between 60mg  and 80mg  every other day.    Marland Kitchen TREMFYA 100 MG/ML SOSY INJECT 100 MG INTO THE SKIN EVERY 8 (EIGHT) WEEKS. 1 mL 4  . valACYclovir (VALTREX) 500 MG tablet Take 2,000 mg by mouth See admin instructions. Take 4 tablets (2,000mg ) by mouth as directed at onset of cold sore.    . venlafaxine XR (EFFEXOR-XR) 75 MG 24 hr capsule Take 75 mg by mouth daily.   1  . [START ON 05/06/2021] Oxycodone HCl 10 MG TABS Take 1 tablet (10 mg total) by mouth every 4 (four) hours. 180 tablet 0    Facility-Administered Medications Prior to Visit  Medication Dose Route Frequency Provider Last Rate Last Admin  . ropivacaine (PF) 2 mg/mL (0.2%) (NAROPIN) injection 1 mL  1 mL Epidural Once Molli Barrows, MD        Review of Systems CNS: No confusion or sedation Cardiac: No angina or palpitations GI: No abdominal pain or constipation Constitutional: No nausea vomiting fevers or chills  Objective:  BP 122/84   Pulse 88   Temp (!) 97.1 F (36.2 C)   Resp 18   Ht 5\' 10"  (1.778 m)   Wt 252 lb (114.3 kg)   SpO2 97%   BMI 36.16 kg/m    BP Readings from Last 3 Encounters:  04/19/21 122/84  06/11/20 99/76  05/05/20 104/64     Wt Readings from Last 3 Encounters:  04/19/21 252 lb (114.3 kg)  11/30/20 256 lb (116.1 kg)  06/11/20 250 lb (113.4 kg)     Physical Exam Pt is alert and oriented PERRL EOMI HEART IS RRR no murmur or rub LCTA no wheezing or rales MUSCULOSKELETAL reveals 4 trigger points in the aforementioned areas.  These are reproducible and his primary pain complaint.  His muscle tone and bulk is good with good range of motion at the glenohumeral joint shoulder and neck.  Labs  No results found for: HGBA1C Lab Results  Component Value Date   CREATININE 2.08 (H) 02/13/2019    -------------------------------------------------------------------------------------------------------------------- Lab Results  Component Value Date   WBC 7.5 02/13/2019   HGB 11.8 (L) 02/13/2019   HCT 37.2 (L) 02/13/2019   PLT 316 02/13/2019   GLUCOSE 142 (H) 02/13/2019   ALT 12 10/06/2013   AST 17 10/06/2013   NA 139 02/13/2019   K 4.3 02/13/2019   CL 102 02/13/2019   CREATININE 2.08 (H) 02/13/2019   BUN 26 (H) 02/13/2019   CO2 25 02/13/2019   INR 1.24 02/18/2019    --------------------------------------------------------------------------------------------------------------------- No results found.   Assessment & Plan:   Adam Andrade was seen today for shoulder  pain.  Diagnoses and all orders for this visit:  Chronic pain in left shoulder  Chronic pain syndrome -     ToxASSURE Select 13 (MW), Urine  Chronic, continuous use of opioids -     ToxASSURE Select 13 (MW), Urine  Cervicalgia  Bilateral hand pain  Pain in joint of left shoulder  Pain in joint of right shoulder  Chronic bilateral low back pain without sciatica  Neuralgic facial pain  Other orders -     Oxycodone HCl 10 MG  TABS; Take 1 tablet (10 mg total) by mouth every 4 (four) hours. -     dexamethasone (DECADRON) injection 10 mg -     ropivacaine (PF) 2 mg/mL (0.2%) (NAROPIN) injection 10 mL        ----------------------------------------------------------------------------------------------------------------------  Problem List Items Addressed This Visit      Unprioritized   Chronic pain in left shoulder - Primary   Relevant Medications   Oxycodone HCl 10 MG TABS (Start on 06/05/2021)   ropivacaine (PF) 2 mg/mL (0.2%) (NAROPIN) injection 10 mL   Chronic pain syndrome   Relevant Medications   Oxycodone HCl 10 MG TABS (Start on 06/05/2021)   ropivacaine (PF) 2 mg/mL (0.2%) (NAROPIN) injection 10 mL   Other Relevant Orders   ToxASSURE Select 13 (MW), Urine   Pain in joint, shoulder region    Other Visit Diagnoses    Chronic, continuous use of opioids       Relevant Orders   ToxASSURE Select 13 (MW), Urine   Cervicalgia       Bilateral hand pain       Chronic bilateral low back pain without sciatica       Relevant Medications   Oxycodone HCl 10 MG TABS (Start on 06/05/2021)   dexamethasone (DECADRON) injection 10 mg (Completed)   Neuralgic facial pain            ----------------------------------------------------------------------------------------------------------------------  1. Chronic pain in left shoulder We will proceed with trigger point injections to the left lateral deltoid and left lateral upper arm.  We have gone over the risks and  benefits of the procedure with him in full detail and all questions are answered.  2. Chronic pain syndrome I have reviewed the [facility] practitioner database information and it is appropriate for refill.  He continues to derive good functional benefit from the opioids with no side effects.  Unfortunately he has failed more conservative therapy.  I will refill his medications for the next 2 months.  He will be scheduled for 74-month return to clinic. - ToxASSURE Select 13 (MW), Urine  3. Chronic, continuous use of opioids As above - ToxASSURE Select 13 (MW), Urine  4. Cervicalgia As above and continue with stretching strengthening exercises as reviewed today.  5. Bilateral hand pain   6. Pain in joint of left shoulder   7. Pain in joint of right shoulder Trigger point injection to the right lateral shoulder and right upper arm today.  8. Chronic bilateral low back pain without sciatica As above  9. Neuralgic facial pain As above    ----------------------------------------------------------------------------------------------------------------------  I am having [name] maintain his Tradjenta, fenofibrate, lansoprazole, clobetasol ointment, sitaGLIPtin-metformin, naloxone, venlafaxine XR, apixaban, losartan, torsemide, meloxicam, amiodarone, atorvastatin, levothyroxine, Magnesium, metoprolol succinate, chlorhexidine, valACYclovir, spironolactone, metolazone, digoxin, Jardiance, metFORMIN, Oxycodone HCl, Tremfya, Tremfya, and Oxycodone HCl. We administered dexamethasone. We will continue to administer ropivacaine (PF) 2 mg/mL (0.2%).   Meds ordered this encounter  Medications  . Oxycodone HCl 10 MG TABS    Sig: Take 1 tablet (10 mg total) by mouth every 4 (four) hours.    Dispense:  180 tablet    Refill:  0  . dexamethasone (DECADRON) injection 10 mg  . ropivacaine (PF) 2 mg/mL (0.2%) (NAROPIN) injection 10 mL   Patient's Medications  New Prescriptions   No  medications on file  Previous Medications   AMIODARONE (PACERONE) 200 MG TABLET    Take 200 mg by mouth daily.    APIXABAN (ELIQUIS) 5 MG TABS  TABLET    Take 5 mg by mouth 2 (two) times daily.    ATORVASTATIN (LIPITOR) 40 MG TABLET    Take 40 mg by mouth at bedtime.   CHLORHEXIDINE (PERIDEX) 0.12 % SOLUTION    15 mLs by Mouth Rinse route 2 (two) times daily.   CLOBETASOL OINTMENT (TEMOVATE) 0.05 %    Apply 1 application topically daily as needed (psoriasis).    DIGOXIN (LANOXIN) 0.125 MG TABLET    Take by mouth.   EMPAGLIFLOZIN (JARDIANCE) 10 MG TABS TABLET    Jardiance 10 mg tablet  TAKE 1 TABLET BY MOUTH DAILY WITH BREAKFAST   FENOFIBRATE (TRICOR) 145 MG TABLET    Take 145 mg by mouth at bedtime.    GUSELKUMAB (TREMFYA) 100 MG/ML SOSY    INJECT 1 SYRINGE UNDER THE SKIN EVERY 8 WEEKS.   LANSOPRAZOLE (PREVACID) 30 MG CAPSULE    Take 30 mg by mouth every morning.    LEVOTHYROXINE (SYNTHROID, LEVOTHROID) 50 MCG TABLET    Take 50 mcg by mouth daily before breakfast.    LINAGLIPTIN (TRADJENTA) 5 MG TABS TABLET    Take 5 mg by mouth daily.    LOSARTAN (COZAAR) 25 MG TABLET    Take 25 mg by mouth daily.   MAGNESIUM 400 MG CAPS    Take 400 mg by mouth 2 (two) times daily.   MELOXICAM (MOBIC) 15 MG TABLET    Take 15 mg by mouth daily.    METFORMIN (GLUCOPHAGE) 1000 MG TABLET    Take 1,000 mg by mouth 2 (two) times daily.   METOLAZONE (ZAROXOLYN) 2.5 MG TABLET    Take 2.5 mg by mouth daily.   METOPROLOL SUCCINATE (TOPROL-XL) 25 MG 24 HR TABLET    Take 50 mg by mouth daily. Take with or immediately following a meal.   NALOXONE (NARCAN) NASAL SPRAY 4 MG/0.1 ML    Place 1 spray into the nose as needed (opioid overdose).    OXYCODONE HCL 10 MG TABS    Take 1 tablet (10 mg total) by mouth every 4 (four) hours.   SITAGLIPTIN-METFORMIN (JANUMET) 50-1000 MG TABLET    Take 1 tablet by mouth 2 (two) times daily with a meal.    SPIRONOLACTONE (ALDACTONE) 25 MG TABLET    Take 25 mg by mouth daily.   TORSEMIDE  (DEMADEX) 20 MG TABLET    Take 60-80 mg by mouth See admin instructions. Alternate between 60mg  and 80mg  every other day.   TREMFYA 100 MG/ML SOSY    INJECT 100 MG INTO THE SKIN EVERY 8 (EIGHT) WEEKS.   VALACYCLOVIR (VALTREX) 500 MG TABLET    Take 2,000 mg by mouth See admin instructions. Take 4 tablets (2,000mg ) by mouth as directed at onset of cold sore.   VENLAFAXINE XR (EFFEXOR-XR) 75 MG 24 HR CAPSULE    Take 75 mg by mouth daily.   Modified Medications   Modified Medication Previous Medication   OXYCODONE HCL 10 MG TABS Oxycodone HCl 10 MG TABS      Take 1 tablet (10 mg total) by mouth every 4 (four) hours.    Take 1 tablet (10 mg total) by mouth every 4 (four) hours.  Discontinued Medications   No medications on file   ----------------------------------------------------------------------------------------------------------------------  Follow-up: Return in about 2 months (around 06/19/2021) for evaluation, med refill.  Trigger point injection: The area overlying the aforementioned trigger points were prepped with alcohol. They were then injected with a 25-gauge needle with 2.5 cc of ropivacaine 0.2%  and Decadron 2.5 mg at each site after negative aspiration for heme. This was performed after informed consent was obtained and risks and benefits reviewed. She tolerated this procedure without difficulty and was convalesced and discharged to home in stable condition for follow-up as mentioned.  @Anchor Dwan  Andree Elk, MD@  Molli Barrows, MD

## 2021-04-26 ENCOUNTER — Telehealth: Payer: Self-pay | Admitting: Physician Assistant

## 2021-04-26 LAB — TOXASSURE SELECT 13 (MW), URINE

## 2021-04-26 MED ORDER — VALACYCLOVIR HCL 500 MG PO TABS: 2000.0000 mg | ORAL_TABLET | ORAL | 2 refills | Status: AC

## 2021-04-26 NOTE — Telephone Encounter (Signed)
Needs refill of Valtrex sent to [facility] on [address] in [place]. Also Tremfya needs PA.

## 2021-04-26 NOTE — Telephone Encounter (Signed)
Phone call to patient to inform him that the prescription for the Valtrex has been sent in and the PA will be done today.  Patient aware.

## 2021-05-10 ENCOUNTER — Other Ambulatory Visit: Payer: Self-pay

## 2021-05-10 ENCOUNTER — Encounter (HOSPITAL_COMMUNITY): Payer: Self-pay | Admitting: *Deleted

## 2021-05-10 ENCOUNTER — Emergency Department (HOSPITAL_COMMUNITY)
Admission: EM | Admit: 2021-05-10 | Discharge: 2021-05-10 | Disposition: A | Discharge status: Home / Self Care | Payer: BC Managed Care – PPO | Attending: Emergency Medicine | Admitting: Emergency Medicine

## 2021-05-10 DIAGNOSIS — E039 Hypothyroidism, unspecified: Secondary | ICD-10-CM | POA: Insufficient documentation

## 2021-05-10 DIAGNOSIS — Z87891 Personal history of nicotine dependence: Secondary | ICD-10-CM | POA: Diagnosis not present

## 2021-05-10 DIAGNOSIS — Z85818 Personal history of malignant neoplasm of other sites of lip, oral cavity, and pharynx: Secondary | ICD-10-CM | POA: Insufficient documentation

## 2021-05-10 DIAGNOSIS — N189 Chronic kidney disease, unspecified: Secondary | ICD-10-CM | POA: Diagnosis not present

## 2021-05-10 DIAGNOSIS — Z7984 Long term (current) use of oral hypoglycemic drugs: Secondary | ICD-10-CM | POA: Diagnosis not present

## 2021-05-10 DIAGNOSIS — I5022 Chronic systolic (congestive) heart failure: Secondary | ICD-10-CM | POA: Insufficient documentation

## 2021-05-10 DIAGNOSIS — E1122 Type 2 diabetes mellitus with diabetic chronic kidney disease: Secondary | ICD-10-CM | POA: Insufficient documentation

## 2021-05-10 DIAGNOSIS — M62830 Muscle spasm of back: Secondary | ICD-10-CM | POA: Insufficient documentation

## 2021-05-10 DIAGNOSIS — Z7901 Long term (current) use of anticoagulants: Secondary | ICD-10-CM | POA: Diagnosis not present

## 2021-05-10 DIAGNOSIS — Z85118 Personal history of other malignant neoplasm of bronchus and lung: Secondary | ICD-10-CM | POA: Insufficient documentation

## 2021-05-10 DIAGNOSIS — I4891 Unspecified atrial fibrillation: Secondary | ICD-10-CM | POA: Diagnosis not present

## 2021-05-10 DIAGNOSIS — Z79899 Other long term (current) drug therapy: Secondary | ICD-10-CM | POA: Diagnosis not present

## 2021-05-10 DIAGNOSIS — I130 Hypertensive heart and chronic kidney disease with heart failure and stage 1 through stage 4 chronic kidney disease, or unspecified chronic kidney disease: Secondary | ICD-10-CM | POA: Insufficient documentation

## 2021-05-10 DIAGNOSIS — M549 Dorsalgia, unspecified: Secondary | ICD-10-CM | POA: Diagnosis present

## 2021-05-10 DIAGNOSIS — Z950 Presence of cardiac pacemaker: Secondary | ICD-10-CM | POA: Insufficient documentation

## 2021-05-10 MED ORDER — METHOCARBAMOL 500 MG PO TABS
750.0000 mg | ORAL_TABLET | Freq: Once | ORAL | Status: AC
  Administered 2021-05-10: 750 mg via ORAL
  Filled 2021-05-10: qty 2

## 2021-05-10 MED ORDER — FENTANYL CITRATE (PF) 100 MCG/2ML IJ SOLN
50.0000 ug | Freq: Once | INTRAMUSCULAR | Status: AC
Start: 2021-05-10 — End: 2021-05-10
  Administered 2021-05-10: 50 ug via INTRAMUSCULAR
  Filled 2021-05-10: qty 2

## 2021-05-10 MED ORDER — METHOCARBAMOL 500 MG PO TABS: 500.0000 mg | ORAL_TABLET | Freq: Two times a day (BID) | ORAL | 0 refills | Status: AC

## 2021-05-10 NOTE — Discharge Instructions (Addendum)
You were given a prescription for Robaxin which is a muscle relaxer.  You should not drive, work, or operate machinery while taking this medication as it can make you very drowsy.    Use the lidoderm patches as directed   Please return to the emergency department for any new or worsening symptoms.  Return to the emergency department immediately if you experience any back pain associated with fevers, loss of control of your bowels/bladder, weakness/numbness to your legs, numbness to your groin area, inability to walk, or inability to urinate.

## 2021-05-10 NOTE — ED Triage Notes (Signed)
Pt with left lower back pain since working on his lawnmower this morning, has taken 10 mg percocet without relief.
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
## 2021-05-26 ENCOUNTER — Encounter: Payer: Self-pay | Admitting: Physician Assistant

## 2021-05-26 ENCOUNTER — Ambulatory Visit (INDEPENDENT_AMBULATORY_CARE_PROVIDER_SITE_OTHER): Payer: BC Managed Care – PPO | Admitting: Physician Assistant

## 2021-05-26 ENCOUNTER — Other Ambulatory Visit: Payer: Self-pay

## 2021-05-26 DIAGNOSIS — L089 Local infection of the skin and subcutaneous tissue, unspecified: Secondary | ICD-10-CM

## 2021-05-26 DIAGNOSIS — B9689 Other specified bacterial agents as the cause of diseases classified elsewhere: Secondary | ICD-10-CM | POA: Diagnosis not present

## 2021-05-26 MED ORDER — MUPIROCIN 2 % EX OINT: 1.0000 "application " | TOPICAL_OINTMENT | Freq: Two times a day (BID) | CUTANEOUS | 2 refills | Status: AC

## 2021-05-26 MED ORDER — DOXYCYCLINE HYCLATE 100 MG PO CAPS: ORAL_CAPSULE | ORAL | 0 refills | Status: AC

## 2021-05-26 NOTE — Progress Notes (Signed)
   Follow-Up Visit   Subjective  Adam Andrade is a 60 y.o. male who presents for the following: Skin Problem (New lesion on left hip x 1 week- red and draining but not sore tx- peroxide and alcohol). No history of a bite. He has psoriasis and takes and immune modifying biologic medication ( Tremfya). He hasn't had any problems in the past with infections.   The following portions of the chart were reviewed this encounter and updated as appropriate:  Tobacco  Allergies  Meds  Problems  Med Hx  Surg Hx  Fam Hx      Objective  Well appearing patient in no apparent distress; mood and affect are within normal limits.  A focused examination was performed including left leg. Relevant physical exam findings are noted in the Assessment and Plan.  Objective  Left Thigh - Anterior: Red circle with large ulceration.    Assessment & Plan  Skin infection, bacterial Left Thigh - Anterior  Anaerobic and Aerobic Culture - Left Thigh - Anterior  doxycycline (VIBRAMYCIN) 100 MG capsule - Left Thigh - Anterior  mupirocin ointment (BACTROBAN) 2 % - Left Thigh - Anterior    I, Daysi Boggan, PA-C, have reviewed all documentation's for this visit.  The documentation on 05/26/21 for the exam, diagnosis, procedures and orders are all accurate and complete.

## 2021-06-01 ENCOUNTER — Telehealth: Payer: Self-pay | Admitting: *Deleted

## 2021-06-01 NOTE — Telephone Encounter (Signed)
-----   Message from Warren Danes, [place] sent at 06/01/2021  1:46 PM EDT ----- Please ask labs for susceptibility testing.

## 2021-06-01 NOTE — Telephone Encounter (Signed)
Called  [facility] to add susceptibility testing to bacteria results.

## 2021-06-02 ENCOUNTER — Telehealth: Payer: Self-pay | Admitting: Physician Assistant

## 2021-06-02 NOTE — Telephone Encounter (Signed)
Bacteria results to patient- informed patient that we are waiting on lab to send [place] back susceptibility. Told patient to call us back monday

## 2021-06-02 NOTE — Telephone Encounter (Signed)
Patient left message on office voice mail that he was calling for pathology results from last visit with [facility], PA-C.

## 2021-06-03 ENCOUNTER — Telehealth: Payer: Self-pay

## 2021-06-03 LAB — ANAEROBIC AND AEROBIC CULTURE
MICRO NUMBER:: 11955917
MICRO NUMBER:: 11955918
SPECIMEN QUALITY:: ADEQUATE
SPECIMEN QUALITY:: ADEQUATE

## 2021-06-03 NOTE — Telephone Encounter (Signed)
Phone call from patient returning our call.  Culture results given to patient.  Patient states that he's healing well and there are no new spots.  Robyne Askew, PA aware.

## 2021-06-03 NOTE — Telephone Encounter (Signed)
-----   Message from Warren Danes, [place] sent at 06/03/2021 12:05 PM EDT ----- Check status.If not doing well will have to change antibiotics.  Does he have any other areas popping up? ----- Message ----- From: Interface, [facility] Results In Sent: 05/27/2021  10:18 AM EDT To: Warren Danes, PA-C

## 2021-06-03 NOTE — Telephone Encounter (Signed)
Phone call to patient with his culture results. Voicemail left for patient to give the office a call back.

## 2021-06-08 ENCOUNTER — Ambulatory Visit: Payer: BC Managed Care – PPO | Admitting: Physician Assistant

## 2021-06-14 ENCOUNTER — Ambulatory Visit: Payer: BC Managed Care – PPO | Attending: Anesthesiology | Admitting: Anesthesiology

## 2021-06-14 ENCOUNTER — Other Ambulatory Visit: Payer: Self-pay

## 2021-06-14 DIAGNOSIS — M5432 Sciatica, left side: Secondary | ICD-10-CM | POA: Insufficient documentation

## 2021-06-14 DIAGNOSIS — G8929 Other chronic pain: Secondary | ICD-10-CM

## 2021-06-14 DIAGNOSIS — G894 Chronic pain syndrome: Secondary | ICD-10-CM | POA: Diagnosis not present

## 2021-06-14 DIAGNOSIS — M25512 Pain in left shoulder: Secondary | ICD-10-CM | POA: Diagnosis not present

## 2021-06-14 DIAGNOSIS — F1190 Opioid use, unspecified, uncomplicated: Secondary | ICD-10-CM

## 2021-06-14 DIAGNOSIS — M79641 Pain in right hand: Secondary | ICD-10-CM

## 2021-06-14 DIAGNOSIS — M79642 Pain in left hand: Secondary | ICD-10-CM

## 2021-06-14 DIAGNOSIS — M545 Low back pain, unspecified: Secondary | ICD-10-CM

## 2021-06-14 DIAGNOSIS — M542 Cervicalgia: Secondary | ICD-10-CM | POA: Diagnosis not present

## 2021-06-14 MED ORDER — OXYCODONE HCL 10 MG PO TABS: 10.0000 mg | ORAL_TABLET | ORAL | 0 refills | Status: DC

## 2021-06-14 MED ORDER — OXYCODONE HCL 10 MG PO TABS: 10.0000 mg | ORAL_TABLET | ORAL | 0 refills | Status: AC

## 2021-06-16 NOTE — Progress Notes (Signed)
Virtual Visit via Video Note  I connected with Adam Andrade on 06/16/21 at  2:15 PM EDT by a video enabled telemedicine application and verified that I am speaking with the correct person using two identifiers.  Location: Patient: Home Provider: Pain control center   I discussed the limitations of evaluation and management by telemedicine and the availability of in person appointments. The patient expressed understanding and agreed to proceed.  History of Present Illness: I spoke with Adam Andrade today via telephone as we were unable to link for the video portion of virtual conference.  He reports that his low back pain has been doing reasonably well and that his shoulder pain is under control.  He recently had trigger point injections that help with that.  Furthermore his hand pain has been stable and no new neck pain is noted.  Facial pain was a problem previously but this is been under good control as well.  He is taking his medications as prescribed and these continue to give him good relief he rates this at approximate 50 to 75% lasting 4 to 6 hours.  He derives good functional benefit from the medications with no side effects and better sleep.  Otherwise the quality characteristic and distribution of his pain complex have been stable without change reported today  Review of systems: General: No fevers or chills Cardiac: No angina or palpitation Pulmonary: No dyspnea or shortness of breath GI: No abdominal pain or constipation   Observations/Objective:  Current Outpatient Medications:    [START ON 08/04/2021] Oxycodone HCl 10 MG TABS, Take 1 tablet (10 mg total) by mouth every 4 (four) hours., Disp: 180 tablet, Rfl: 0   amiodarone (PACERONE) 200 MG tablet, Take 200 mg by mouth daily. , Disp: , Rfl:    apixaban (ELIQUIS) 5 MG TABS tablet, Take 5 mg by mouth 2 (two) times daily. , Disp: , Rfl:    atorvastatin (LIPITOR) 40 MG tablet, Take 40 mg by mouth at bedtime., Disp: , Rfl:     chlorhexidine (PERIDEX) 0.12 % solution, 15 mLs by Mouth Rinse route 2 (two) times daily., Disp: , Rfl:    clobetasol ointment (TEMOVATE) 6.21 %, Apply 1 application topically daily as needed (psoriasis). , Disp: , Rfl:    doxycycline (VIBRAMYCIN) 100 MG capsule, Take 1 po bid x 14 days, Disp: 28 capsule, Rfl: 0   empagliflozin (JARDIANCE) 10 MG TABS tablet, Jardiance 10 mg tablet  TAKE 1 TABLET BY MOUTH DAILY WITH BREAKFAST, Disp: , Rfl:    fenofibrate (TRICOR) 145 MG tablet, Take 145 mg by mouth at bedtime. , Disp: , Rfl:    Guselkumab (TREMFYA) 100 MG/ML SOSY, INJECT 1 SYRINGE UNDER THE SKIN EVERY 8 WEEKS., Disp: 1 mL, Rfl: 2   lansoprazole (PREVACID) 30 MG capsule, Take 30 mg by mouth every morning. , Disp: , Rfl:    levothyroxine (SYNTHROID, LEVOTHROID) 50 MCG tablet, Take 50 mcg by mouth daily before breakfast. , Disp: , Rfl:    linagliptin (TRADJENTA) 5 MG TABS tablet, Take 5 mg by mouth daily. , Disp: , Rfl:    losartan (COZAAR) 25 MG tablet, Take 25 mg by mouth daily., Disp: , Rfl:    Magnesium 400 MG CAPS, Take 400 mg by mouth 2 (two) times daily., Disp: , Rfl:    meloxicam (MOBIC) 15 MG tablet, Take 15 mg by mouth daily. , Disp: , Rfl:    metFORMIN (GLUCOPHAGE) 1000 MG tablet, Take 1,000 mg by mouth 2 (two) times daily., Disp: ,  Rfl:    methocarbamol (ROBAXIN) 500 MG tablet, Take 1 tablet (500 mg total) by mouth 2 (two) times daily., Disp: 20 tablet, Rfl: 0   metolazone (ZAROXOLYN) 2.5 MG tablet, Take 2.5 mg by mouth daily., Disp: , Rfl:    metoprolol succinate (TOPROL-XL) 25 MG 24 hr tablet, Take 50 mg by mouth daily. Take with or immediately following a meal., Disp: , Rfl:    mupirocin ointment (BACTROBAN) 2 %, Apply 1 application topically 2 (two) times daily., Disp: 22 g, Rfl: 2   naloxone (NARCAN) nasal spray 4 mg/0.1 mL, Place 1 spray into the nose as needed (opioid overdose). , Disp: , Rfl:    [START ON 07/05/2021] Oxycodone HCl 10 MG TABS, Take 1 tablet (10 mg total) by mouth  every 4 (four) hours., Disp: 180 tablet, Rfl: 0   sitaGLIPtin-metformin (JANUMET) 50-1000 MG tablet, Take 1 tablet by mouth 2 (two) times daily with a meal. , Disp: , Rfl:    spironolactone (ALDACTONE) 25 MG tablet, Take 25 mg by mouth daily., Disp: , Rfl:    torsemide (DEMADEX) 20 MG tablet, Take 60-80 mg by mouth See admin instructions. Alternate between 60mg  and 80mg  every other day., Disp: , Rfl:    TREMFYA 100 MG/ML SOSY, INJECT 100 MG INTO THE SKIN EVERY 8 (EIGHT) WEEKS., Disp: 1 mL, Rfl: 4   valACYclovir (VALTREX) 500 MG tablet, Take 4 tablets (2,000 mg total) by mouth See admin instructions. Take 4 tablets (2,000mg ) by mouth as directed at onset of cold sore., Disp: 30 tablet, Rfl: 2   venlafaxine XR (EFFEXOR-XR) 75 MG 24 hr capsule, Take 75 mg by mouth daily. , Disp: , Rfl: 1  Current Facility-Administered Medications:    ropivacaine (PF) 2 mg/mL (0.2%) (NAROPIN) injection 1 mL, 1 mL, Epidural, Once, [name], MD   Assessment and Plan:  1. Chronic pain in left shoulder   2. Chronic pain syndrome   3. Chronic, continuous use of opioids   4. Cervicalgia   5. Bilateral hand pain   6. Pain in joint of left shoulder   7. Chronic bilateral low back pain without sciatica   8. Left sided sciatica   Based on our discussion today and upon review of the [facility] practitioner database information I am going to refill his medications for the next 2 months.  We will schedule him for 52-month return to clinic.  We talked about some exercises and stretching that can be done in the meantime.  I want him to continue follow-up with his primary care physicians for baseline medical care. Follow Up Instructions:    I discussed the assessment and treatment plan with the patient. The patient was provided an opportunity to ask questions and all were answered. The patient agreed with the plan and demonstrated an understanding of the instructions.   The patient was advised to call back or seek an  in-person evaluation if the symptoms worsen or if the condition fails to improve as anticipated.  I provided 25 minutes of non-face-to-face time during this encounter.   Molli Barrows, MD

## 2021-08-25 ENCOUNTER — Encounter: Payer: Self-pay | Admitting: Anesthesiology

## 2021-08-25 ENCOUNTER — Other Ambulatory Visit: Payer: Self-pay

## 2021-08-25 ENCOUNTER — Ambulatory Visit: Payer: BC Managed Care – PPO | Attending: Anesthesiology | Admitting: Anesthesiology

## 2021-08-25 DIAGNOSIS — G894 Chronic pain syndrome: Secondary | ICD-10-CM

## 2021-08-25 DIAGNOSIS — G8929 Other chronic pain: Secondary | ICD-10-CM

## 2021-08-25 DIAGNOSIS — M79642 Pain in left hand: Secondary | ICD-10-CM

## 2021-08-25 DIAGNOSIS — M25512 Pain in left shoulder: Secondary | ICD-10-CM

## 2021-08-25 DIAGNOSIS — M542 Cervicalgia: Secondary | ICD-10-CM

## 2021-08-25 DIAGNOSIS — M79641 Pain in right hand: Secondary | ICD-10-CM

## 2021-08-25 DIAGNOSIS — F1190 Opioid use, unspecified, uncomplicated: Secondary | ICD-10-CM | POA: Diagnosis not present

## 2021-08-25 DIAGNOSIS — M25511 Pain in right shoulder: Secondary | ICD-10-CM

## 2021-08-25 DIAGNOSIS — M545 Low back pain, unspecified: Secondary | ICD-10-CM

## 2021-08-25 DIAGNOSIS — M5432 Sciatica, left side: Secondary | ICD-10-CM

## 2021-08-25 DIAGNOSIS — G518 Other disorders of facial nerve: Secondary | ICD-10-CM

## 2021-08-25 MED ORDER — OXYCODONE HCL 10 MG PO TABS: 10.0000 mg | ORAL_TABLET | ORAL | 0 refills | Status: AC

## 2021-08-25 MED ORDER — OXYCODONE HCL 10 MG PO TABS: 10.0000 mg | ORAL_TABLET | ORAL | 0 refills | Status: DC

## 2021-08-25 NOTE — Progress Notes (Signed)
Virtual Visit via Telephone Note  I connected with Adam Andrade on 08/25/21 at  2:05 PM EDT by telephone and verified that I am speaking with the correct person using two identifiers.  Location: Patient: Home Provider: [facility]   I discussed the limitations, risks, security and privacy concerns of performing an evaluation and management service by telephone and the availability of in person appointments. I also discussed with the patient that there may be a patient responsible charge related to this service. The patient expressed understanding and agreed to proceed.   History of Present Illness: I spoke with Adam Andrade via telephone today as we are unable to link to the video portion of virtual conferencing stating he is doing reasonably well with his low back pain shoulder pain and hand pain.  He is taking his medications as prescribed and these continue to work well for him.  He denies any side effects and continues to derive good functional benefit from the medicines.  He generally has pain throughout the day and its intensity depends on his activity and work.  These help him continue to work full-time and stay active.  No side effects are reported with the medications and unfortunately has failed conservative therapy but these are working well and he has been on these chronically.  Otherwise he is in his usual state of health and no other contributory changes are noted today.   Review of systems: General: No fevers or chills Pulmonary: No shortness of breath or dyspnea Cardiac: No angina or palpitations or lightheadedness GI: No abdominal pain or constipation Psych: No depression      Observations/Objective:  Current Outpatient Medications:    [START ON 10/03/2021] Oxycodone HCl 10 MG TABS, Take 1 tablet (10 mg total) by mouth every 4 (four) hours., Disp: 180 tablet, Rfl: 0   amiodarone (PACERONE) 200 MG tablet, Take 200 mg by mouth daily. , Disp: , Rfl:    apixaban  (ELIQUIS) 5 MG TABS tablet, Take 5 mg by mouth 2 (two) times daily. , Disp: , Rfl:    atorvastatin (LIPITOR) 40 MG tablet, Take 40 mg by mouth at bedtime., Disp: , Rfl:    chlorhexidine (PERIDEX) 0.12 % solution, 15 mLs by Mouth Rinse route 2 (two) times daily., Disp: , Rfl:    clobetasol ointment (TEMOVATE) 7.40 %, Apply 1 application topically daily as needed (psoriasis). , Disp: , Rfl:    doxycycline (VIBRAMYCIN) 100 MG capsule, Take 1 po bid x 14 days, Disp: 28 capsule, Rfl: 0   empagliflozin (JARDIANCE) 10 MG TABS tablet, Jardiance 10 mg tablet  TAKE 1 TABLET BY MOUTH DAILY WITH BREAKFAST, Disp: , Rfl:    fenofibrate (TRICOR) 145 MG tablet, Take 145 mg by mouth at bedtime. , Disp: , Rfl:    Guselkumab (TREMFYA) 100 MG/ML SOSY, INJECT 1 SYRINGE UNDER THE SKIN EVERY 8 WEEKS., Disp: 1 mL, Rfl: 2   lansoprazole (PREVACID) 30 MG capsule, Take 30 mg by mouth every morning. , Disp: , Rfl:    levothyroxine (SYNTHROID, LEVOTHROID) 50 MCG tablet, Take 50 mcg by mouth daily before breakfast. , Disp: , Rfl:    linagliptin (TRADJENTA) 5 MG TABS tablet, Take 5 mg by mouth daily. , Disp: , Rfl:    losartan (COZAAR) 25 MG tablet, Take 25 mg by mouth daily., Disp: , Rfl:    Magnesium 400 MG CAPS, Take 400 mg by mouth 2 (two) times daily., Disp: , Rfl:    meloxicam (MOBIC) 15 MG tablet,  Take 15 mg by mouth daily. , Disp: , Rfl:    metFORMIN (GLUCOPHAGE) 1000 MG tablet, Take 1,000 mg by mouth 2 (two) times daily., Disp: , Rfl:    methocarbamol (ROBAXIN) 500 MG tablet, Take 1 tablet (500 mg total) by mouth 2 (two) times daily., Disp: 20 tablet, Rfl: 0   metolazone (ZAROXOLYN) 2.5 MG tablet, Take 2.5 mg by mouth daily., Disp: , Rfl:    metoprolol succinate (TOPROL-XL) 25 MG 24 hr tablet, Take 50 mg by mouth daily. Take with or immediately following a meal., Disp: , Rfl:    mupirocin ointment (BACTROBAN) 2 %, Apply 1 application topically 2 (two) times daily., Disp: 22 g, Rfl: 2   naloxone (NARCAN) nasal spray 4  mg/0.1 mL, Place 1 spray into the nose as needed (opioid overdose). , Disp: , Rfl:    [START ON 09/03/2021] Oxycodone HCl 10 MG TABS, Take 1 tablet (10 mg total) by mouth every 4 (four) hours., Disp: 180 tablet, Rfl: 0   sitaGLIPtin-metformin (JANUMET) 50-1000 MG tablet, Take 1 tablet by mouth 2 (two) times daily with a meal. , Disp: , Rfl:    spironolactone (ALDACTONE) 25 MG tablet, Take 25 mg by mouth daily., Disp: , Rfl:    torsemide (DEMADEX) 20 MG tablet, Take 60-80 mg by mouth See admin instructions. Alternate between 60mg  and 80mg  every other day., Disp: , Rfl:    TREMFYA 100 MG/ML SOSY, INJECT 100 MG INTO THE SKIN EVERY 8 (EIGHT) WEEKS., Disp: 1 mL, Rfl: 4   valACYclovir (VALTREX) 500 MG tablet, Take 4 tablets (2,000 mg total) by mouth See admin instructions. Take 4 tablets (2,000mg ) by mouth as directed at onset of cold sore., Disp: 30 tablet, Rfl: 2   venlafaxine XR (EFFEXOR-XR) 75 MG 24 hr capsule, Take 75 mg by mouth daily. , Disp: , Rfl: 1  Current Facility-Administered Medications:    ropivacaine (PF) 2 mg/mL (0.2%) (NAROPIN) injection 1 mL, 1 mL, Epidural, Once, [name], MD   Assessment and Plan: 1. Chronic pain in left shoulder   2. Chronic pain syndrome   3. Chronic, continuous use of opioids   4. Bilateral hand pain   5. Cervicalgia   6. Pain in joint of left shoulder   7. Chronic bilateral low back pain without sciatica   8. Left sided sciatica   9. Neuralgic facial pain   10. Pain of both shoulder joints   Based on our discussion today it is appropriate for refill for his medications as dated for September 9 October 9.  No changes will be made and the pain management regimen.  I encouraged him to continue with stretching strengthening exercises.  He would like to return to clinic in 2 months for repeat injections for his shoulders.  In the past has had these injections and generally gets 75 to 80% improvement in his shoulder and hand pain when he has injections and  this generally last about 6 to 8 weeks before he has gradual recurrence of this pain.  No other changes are reported.'s hand grasp and strength is good.  Continue follow-up with his primary care physicians for baseline medical care.  Follow Up Instructions:    I discussed the assessment and treatment plan with the patient. The patient was provided an opportunity to ask questions and all were answered. The patient agreed with the plan and demonstrated an understanding of the instructions.   The patient was advised to call back or seek an in-person evaluation if  the symptoms worsen or if the condition fails to improve as anticipated.  I provided 30 minutes of non-face-to-face time during this encounter.   Molli Barrows, MD

## 2021-10-28 ENCOUNTER — Other Ambulatory Visit: Payer: Self-pay

## 2021-10-28 ENCOUNTER — Encounter: Payer: Self-pay | Admitting: Anesthesiology

## 2021-10-28 ENCOUNTER — Ambulatory Visit: Payer: BC Managed Care – PPO | Attending: Anesthesiology | Admitting: Anesthesiology

## 2021-10-28 DIAGNOSIS — M542 Cervicalgia: Secondary | ICD-10-CM

## 2021-10-28 DIAGNOSIS — M25512 Pain in left shoulder: Secondary | ICD-10-CM | POA: Diagnosis not present

## 2021-10-28 DIAGNOSIS — M79641 Pain in right hand: Secondary | ICD-10-CM

## 2021-10-28 DIAGNOSIS — G894 Chronic pain syndrome: Secondary | ICD-10-CM

## 2021-10-28 DIAGNOSIS — G518 Other disorders of facial nerve: Secondary | ICD-10-CM

## 2021-10-28 DIAGNOSIS — G8929 Other chronic pain: Secondary | ICD-10-CM

## 2021-10-28 DIAGNOSIS — F1190 Opioid use, unspecified, uncomplicated: Secondary | ICD-10-CM

## 2021-10-28 DIAGNOSIS — M5432 Sciatica, left side: Secondary | ICD-10-CM

## 2021-10-28 DIAGNOSIS — M545 Low back pain, unspecified: Secondary | ICD-10-CM

## 2021-10-28 DIAGNOSIS — M79642 Pain in left hand: Secondary | ICD-10-CM

## 2021-10-28 MED ORDER — OXYCODONE HCL 10 MG PO TABS: 10.0000 mg | ORAL_TABLET | ORAL | 0 refills | Status: DC

## 2021-10-28 MED ORDER — OXYCODONE HCL 10 MG PO TABS
10.0000 mg | ORAL_TABLET | ORAL | 0 refills | Status: AC
Start: 2021-11-02 — End: 2021-12-02

## 2021-10-28 NOTE — Progress Notes (Signed)
Virtual Visit via Telephone Note  I connected with Adam Andrade on 10/28/21 at  1:20 PM EDT by telephone and verified that I am speaking with the correct person using two identifiers.  Location: Patient: Home Provider: [facility] control center   I discussed the limitations, risks, security and privacy concerns of performing an evaluation and management service by telephone and the availability of in person appointments. I also discussed with the patient that there may be a patient responsible charge related to this service. The patient expressed understanding and agreed to proceed.   History of Present Illness: I spoke with Adam Andrade today via telephone.  We were unable link for the video portion of virtual conference he states that he has been doing reasonably well with her existing regimen.  He takes his medication approximately every 4 hours and gets good relief rated about 75% improvement for about 4 hours.  He then breaks through to his baseline pain.  Once again nothing else has worked for him.  No side effects are reported.  The quality characteristic and distribution of the pain is also unchanged and stable in nature.  This is primarily affecting his hands shoulders with associated facial pain and occasionally some low back pain which is intermittent.  Review of systems: General: No fevers or chills Pulmonary: No shortness of breath or dyspnea Cardiac: No angina or palpitations or lightheadedness GI: No abdominal pain or constipation Psych: No depression    Observations/Objective:  Current Outpatient Medications:    [START ON 12/02/2021] Oxycodone HCl 10 MG TABS, Take 1 tablet (10 mg total) by mouth every 4 (four) hours., Disp: 180 tablet, Rfl: 0   amiodarone (PACERONE) 200 MG tablet, Take 200 mg by mouth daily. , Disp: , Rfl:    apixaban (ELIQUIS) 5 MG TABS tablet, Take 5 mg by mouth 2 (two) times daily. , Disp: , Rfl:    atorvastatin (LIPITOR) 40 MG tablet, Take 40 mg by mouth at  bedtime., Disp: , Rfl:    chlorhexidine (PERIDEX) 0.12 % solution, 15 mLs by Mouth Rinse route 2 (two) times daily., Disp: , Rfl:    clobetasol ointment (TEMOVATE) 1.61 %, Apply 1 application topically daily as needed (psoriasis). , Disp: , Rfl:    doxycycline (VIBRAMYCIN) 100 MG capsule, Take 1 po bid x 14 days, Disp: 28 capsule, Rfl: 0   empagliflozin (JARDIANCE) 10 MG TABS tablet, Jardiance 10 mg tablet  TAKE 1 TABLET BY MOUTH DAILY WITH BREAKFAST, Disp: , Rfl:    fenofibrate (TRICOR) 145 MG tablet, Take 145 mg by mouth at bedtime. , Disp: , Rfl:    Guselkumab (TREMFYA) 100 MG/ML SOSY, INJECT 1 SYRINGE UNDER THE SKIN EVERY 8 WEEKS., Disp: 1 mL, Rfl: 2   lansoprazole (PREVACID) 30 MG capsule, Take 30 mg by mouth every morning. , Disp: , Rfl:    levothyroxine (SYNTHROID, LEVOTHROID) 50 MCG tablet, Take 50 mcg by mouth daily before breakfast. , Disp: , Rfl:    linagliptin (TRADJENTA) 5 MG TABS tablet, Take 5 mg by mouth daily. , Disp: , Rfl:    losartan (COZAAR) 25 MG tablet, Take 25 mg by mouth daily., Disp: , Rfl:    Magnesium 400 MG CAPS, Take 400 mg by mouth 2 (two) times daily., Disp: , Rfl:    meloxicam (MOBIC) 15 MG tablet, Take 15 mg by mouth daily. , Disp: , Rfl:    metFORMIN (GLUCOPHAGE) 1000 MG tablet, Take 1,000 mg by mouth 2 (two) times daily., Disp: , Rfl:  methocarbamol (ROBAXIN) 500 MG tablet, Take 1 tablet (500 mg total) by mouth 2 (two) times daily., Disp: 20 tablet, Rfl: 0   metolazone (ZAROXOLYN) 2.5 MG tablet, Take 2.5 mg by mouth daily., Disp: , Rfl:    metoprolol succinate (TOPROL-XL) 25 MG 24 hr tablet, Take 50 mg by mouth daily. Take with or immediately following a meal., Disp: , Rfl:    mupirocin ointment (BACTROBAN) 2 %, Apply 1 application topically 2 (two) times daily., Disp: 22 g, Rfl: 2   naloxone (NARCAN) nasal spray 4 mg/0.1 mL, Place 1 spray into the nose as needed (opioid overdose). , Disp: , Rfl:    [START ON 11/02/2021] Oxycodone HCl 10 MG TABS, Take 1 tablet  (10 mg total) by mouth every 4 (four) hours., Disp: 180 tablet, Rfl: 0   sitaGLIPtin-metformin (JANUMET) 50-1000 MG tablet, Take 1 tablet by mouth 2 (two) times daily with a meal. , Disp: , Rfl:    spironolactone (ALDACTONE) 25 MG tablet, Take 25 mg by mouth daily., Disp: , Rfl:    torsemide (DEMADEX) 20 MG tablet, Take 60-80 mg by mouth See admin instructions. Alternate between 60mg  and 80mg  every other day., Disp: , Rfl:    TREMFYA 100 MG/ML SOSY, INJECT 100 MG INTO THE SKIN EVERY 8 (EIGHT) WEEKS., Disp: 1 mL, Rfl: 4   valACYclovir (VALTREX) 500 MG tablet, Take 4 tablets (2,000 mg total) by mouth See admin instructions. Take 4 tablets (2,000mg ) by mouth as directed at onset of cold sore., Disp: 30 tablet, Rfl: 2   venlafaxine XR (EFFEXOR-XR) 75 MG 24 hr capsule, Take 75 mg by mouth daily. , Disp: , Rfl: 1  Current Facility-Administered Medications:    ropivacaine (PF) 2 mg/mL (0.2%) (NAROPIN) injection 1 mL, 1 mL, Epidural, Once, [name], MD   Assessment and Plan: 1. Chronic pain in left shoulder   2. Chronic pain syndrome   3. Chronic, continuous use of opioids   4. Bilateral hand pain   5. Cervicalgia   6. Pain in joint of left shoulder   7. Chronic bilateral low back pain without sciatica   8. Left sided sciatica   9. Neuralgic facial pain   Based on her discussion today it is appropriate to refill his medications.  We will do this for November 8 and December 8.  No changes will be made in his pharmacologic regimen.  I want him to continue efforts at weight loss stretching strengthening as reviewed once again today.  Continue follow-up with his primary care physicians for baseline medical care and return to clinic in 2 months as scheduled.  Follow Up Instructions:    I discussed the assessment and treatment plan with the patient. The patient was provided an opportunity to ask questions and all were answered. The patient agreed with the plan and demonstrated an understanding of  the instructions.   The patient was advised to call back or seek an in-person evaluation if the symptoms worsen or if the condition fails to improve as anticipated.  I provided 30 minutes of non-face-to-face time during this encounter.   Molli Barrows, MD

## 2021-12-29 ENCOUNTER — Other Ambulatory Visit: Payer: Self-pay

## 2021-12-29 ENCOUNTER — Ambulatory Visit: Payer: BC Managed Care – PPO | Attending: Anesthesiology | Admitting: Anesthesiology

## 2021-12-29 ENCOUNTER — Encounter: Payer: Self-pay | Admitting: Anesthesiology

## 2021-12-29 DIAGNOSIS — M79641 Pain in right hand: Secondary | ICD-10-CM

## 2021-12-29 DIAGNOSIS — M5432 Sciatica, left side: Secondary | ICD-10-CM

## 2021-12-29 DIAGNOSIS — G8929 Other chronic pain: Secondary | ICD-10-CM

## 2021-12-29 DIAGNOSIS — M25512 Pain in left shoulder: Secondary | ICD-10-CM | POA: Diagnosis not present

## 2021-12-29 DIAGNOSIS — F1190 Opioid use, unspecified, uncomplicated: Secondary | ICD-10-CM

## 2021-12-29 DIAGNOSIS — M25511 Pain in right shoulder: Secondary | ICD-10-CM

## 2021-12-29 DIAGNOSIS — M79642 Pain in left hand: Secondary | ICD-10-CM

## 2021-12-29 DIAGNOSIS — G894 Chronic pain syndrome: Secondary | ICD-10-CM | POA: Diagnosis not present

## 2021-12-29 DIAGNOSIS — M542 Cervicalgia: Secondary | ICD-10-CM

## 2021-12-29 DIAGNOSIS — G518 Other disorders of facial nerve: Secondary | ICD-10-CM

## 2021-12-29 DIAGNOSIS — M545 Low back pain, unspecified: Secondary | ICD-10-CM

## 2021-12-29 MED ORDER — OXYCODONE HCL 10 MG PO TABS: 10.0000 mg | ORAL_TABLET | ORAL | 0 refills | Status: AC

## 2021-12-29 NOTE — Progress Notes (Signed)
Virtual Visit via Telephone Note  I connected with Adam Andrade on 12/29/21 at 12:45 PM EST by telephone and verified that I am speaking with the correct person using two identifiers.  Location: Patient: Home Provider: Pain control center   I discussed the limitations, risks, security and privacy concerns of performing an evaluation and management service by telephone and the availability of in person appointments. I also discussed with the patient that there may be a patient responsible charge related to this service. The patient expressed understanding and agreed to proceed.   History of Present Illness: I spoke with Adam Andrade today via telephone as he was unable to link for the video portion of this conference.  He reports that his low back pain and diffuse body pain shoulder pain and hand pain have been stable in nature with no significant changes recently.  He still taking his oxycodone 10 mg tablets about every 4 hours and these continue to work well for him and keep this generalized pain under good control.  He is scheduled for an orthopedic surgical procedure for his hand and thumb within the next month or so.  However at this point he is doing reasonably well with his current regimen.  No changes have been noted and no problems with his medications are reported.  He continues to derive good functional lifestyle improvement with the medicines.  He has failed more conservative therapy historically.  Otherwise he is trying to stay active working at stretching strengthening exercises for his back and shoulders.Review of systems: General: No fevers or chills Pulmonary: No shortness of breath or dyspnea Cardiac: No angina or palpitations or lightheadedness GI: No abdominal pain or constipation Psych: No depression    Observations/Objective:  Current Outpatient Medications:    [START ON 01/31/2022] Oxycodone HCl 10 MG TABS, Take 1 tablet (10 mg total) by mouth every 4 (four) hours., Disp:  180 tablet, Rfl: 0   amiodarone (PACERONE) 200 MG tablet, Take 200 mg by mouth daily. , Disp: , Rfl:    apixaban (ELIQUIS) 5 MG TABS tablet, Take 5 mg by mouth 2 (two) times daily. , Disp: , Rfl:    atorvastatin (LIPITOR) 40 MG tablet, Take 40 mg by mouth at bedtime., Disp: , Rfl:    chlorhexidine (PERIDEX) 0.12 % solution, 15 mLs by Mouth Rinse route 2 (two) times daily., Disp: , Rfl:    clobetasol ointment (TEMOVATE) 7.82 %, Apply 1 application topically daily as needed (psoriasis). , Disp: , Rfl:    doxycycline (VIBRAMYCIN) 100 MG capsule, Take 1 po bid x 14 days, Disp: 28 capsule, Rfl: 0   empagliflozin (JARDIANCE) 10 MG TABS tablet, Jardiance 10 mg tablet  TAKE 1 TABLET BY MOUTH DAILY WITH BREAKFAST, Disp: , Rfl:    fenofibrate (TRICOR) 145 MG tablet, Take 145 mg by mouth at bedtime. , Disp: , Rfl:    Guselkumab (TREMFYA) 100 MG/ML SOSY, INJECT 1 SYRINGE UNDER THE SKIN EVERY 8 WEEKS., Disp: 1 mL, Rfl: 2   lansoprazole (PREVACID) 30 MG capsule, Take 30 mg by mouth every morning. , Disp: , Rfl:    levothyroxine (SYNTHROID, LEVOTHROID) 50 MCG tablet, Take 50 mcg by mouth daily before breakfast. , Disp: , Rfl:    linagliptin (TRADJENTA) 5 MG TABS tablet, Take 5 mg by mouth daily. , Disp: , Rfl:    losartan (COZAAR) 25 MG tablet, Take 25 mg by mouth daily., Disp: , Rfl:    Magnesium 400 MG CAPS, Take 400 mg by mouth  2 (two) times daily., Disp: , Rfl:    meloxicam (MOBIC) 15 MG tablet, Take 15 mg by mouth daily. , Disp: , Rfl:    metFORMIN (GLUCOPHAGE) 1000 MG tablet, Take 1,000 mg by mouth 2 (two) times daily., Disp: , Rfl:    methocarbamol (ROBAXIN) 500 MG tablet, Take 1 tablet (500 mg total) by mouth 2 (two) times daily., Disp: 20 tablet, Rfl: 0   metolazone (ZAROXOLYN) 2.5 MG tablet, Take 2.5 mg by mouth daily., Disp: , Rfl:    metoprolol succinate (TOPROL-XL) 25 MG 24 hr tablet, Take 50 mg by mouth daily. Take with or immediately following a meal., Disp: , Rfl:    mupirocin ointment  (BACTROBAN) 2 %, Apply 1 application topically 2 (two) times daily., Disp: 22 g, Rfl: 2   naloxone (NARCAN) nasal spray 4 mg/0.1 mL, Place 1 spray into the nose as needed (opioid overdose). , Disp: , Rfl:    [START ON 01/01/2022] Oxycodone HCl 10 MG TABS, Take 1 tablet (10 mg total) by mouth every 4 (four) hours., Disp: 180 tablet, Rfl: 0   sitaGLIPtin-metformin (JANUMET) 50-1000 MG tablet, Take 1 tablet by mouth 2 (two) times daily with a meal. , Disp: , Rfl:    spironolactone (ALDACTONE) 25 MG tablet, Take 25 mg by mouth daily., Disp: , Rfl:    torsemide (DEMADEX) 20 MG tablet, Take 60-80 mg by mouth See admin instructions. Alternate between 60mg  and 80mg  every other day., Disp: , Rfl:    TREMFYA 100 MG/ML SOSY, INJECT 100 MG INTO THE SKIN EVERY 8 (EIGHT) WEEKS., Disp: 1 mL, Rfl: 4   valACYclovir (VALTREX) 500 MG tablet, Take 4 tablets (2,000 mg total) by mouth See admin instructions. Take 4 tablets (2,000mg ) by mouth as directed at onset of cold sore., Disp: 30 tablet, Rfl: 2   venlafaxine XR (EFFEXOR-XR) 75 MG 24 hr capsule, Take 75 mg by mouth daily. , Disp: , Rfl: 1  Current Facility-Administered Medications:    ropivacaine (PF) 2 mg/mL (0.2%) (NAROPIN) injection 1 mL, 1 mL, Epidural, Once, Adam Elk Alvina Filbert, MD   Assessment and Plan: 1. Chronic pain in left shoulder   2. Chronic pain syndrome   3. Chronic, continuous use of opioids   4. Bilateral hand pain   5. Cervicalgia   6. Pain in joint of left shoulder   7. Chronic bilateral low back pain without sciatica   8. Left sided sciatica   9. Neuralgic facial pain   10. Pain of both shoulder joints   Based on our discussion today and after review of the [facility] practitioner database information I think it is appropriate to refill his medications.  These will be scheduled for January 7 and February 6 with return to clinic in 2 months.  No other changes in his pharmacologic regimen will be initiated.  I encouraged him to continue efforts  at weight loss stretching strengthening as reviewed with return scheduled in 2 months.  He is to continue follow-up with his orthopedic physicians and primary care physicians for baseline medical care.  Follow Up Instructions:    I discussed the assessment and treatment plan with the patient. The patient was provided an opportunity to ask questions and all were answered. The patient agreed with the plan and demonstrated an understanding of the instructions.   The patient was advised to call back or seek an in-person evaluation if the symptoms worsen or if the condition fails to improve as anticipated.  I provided 30 minutes of  non-face-to-face time during this encounter.   Molli Barrows, MD

## 2022-02-23 DEATH — deceased

## 2022-03-23 ENCOUNTER — Ambulatory Visit: Payer: BC Managed Care – PPO | Admitting: Physician Assistant

## 2022-03-30 ENCOUNTER — Ambulatory Visit: Payer: BC Managed Care – PPO | Admitting: Physician Assistant
